# Patient Record
Sex: Female | Born: 1960 | Race: White | Hispanic: No | Marital: Married | Smoking: Current every day smoker
Health system: Southern US, Community
[De-identification: ages and names within clinical notes are randomized; demographics above are authoritative.]

## PROBLEM LIST (undated history)

## (undated) DIAGNOSIS — B370 Candidal stomatitis: Secondary | ICD-10-CM

## (undated) DIAGNOSIS — I10 Essential (primary) hypertension: Secondary | ICD-10-CM

## (undated) DIAGNOSIS — J449 Chronic obstructive pulmonary disease, unspecified: Secondary | ICD-10-CM

## (undated) DIAGNOSIS — N390 Urinary tract infection, site not specified: Secondary | ICD-10-CM

## (undated) DIAGNOSIS — K219 Gastro-esophageal reflux disease without esophagitis: Secondary | ICD-10-CM

## (undated) DIAGNOSIS — N952 Postmenopausal atrophic vaginitis: Secondary | ICD-10-CM

## (undated) DIAGNOSIS — E785 Hyperlipidemia, unspecified: Secondary | ICD-10-CM

## (undated) DIAGNOSIS — R000 Tachycardia, unspecified: Secondary | ICD-10-CM

## (undated) DIAGNOSIS — R32 Unspecified urinary incontinence: Secondary | ICD-10-CM

## (undated) DIAGNOSIS — N179 Acute kidney failure, unspecified: Secondary | ICD-10-CM

## (undated) DIAGNOSIS — N35919 Unspecified urethral stricture, male, unspecified site: Secondary | ICD-10-CM

## (undated) HISTORY — DX: Unspecified urinary incontinence: R32

## (undated) HISTORY — DX: Candidal stomatitis: B37.0

## (undated) HISTORY — DX: Chronic obstructive pulmonary disease, unspecified: J44.9

## (undated) HISTORY — DX: Postmenopausal atrophic vaginitis: N95.2

## (undated) HISTORY — DX: Gastro-esophageal reflux disease without esophagitis: K21.9

## (undated) HISTORY — PX: ABDOMINAL HYSTERECTOMY: SHX81

## (undated) HISTORY — DX: Unspecified urethral stricture, male, unspecified site: N35.919

## (undated) HISTORY — DX: Hyperlipidemia, unspecified: E78.5

## (undated) HISTORY — DX: Urinary tract infection, site not specified: N39.0

---

## 2008-08-06 ENCOUNTER — Ambulatory Visit: Payer: Self-pay | Admitting: Internal Medicine

## 2010-01-08 ENCOUNTER — Ambulatory Visit: Payer: Self-pay | Admitting: Internal Medicine

## 2010-05-11 ENCOUNTER — Ambulatory Visit: Payer: Self-pay | Admitting: Urology

## 2011-03-17 DIAGNOSIS — Z8719 Personal history of other diseases of the digestive system: Secondary | ICD-10-CM | POA: Insufficient documentation

## 2011-03-17 DIAGNOSIS — G723 Periodic paralysis: Secondary | ICD-10-CM | POA: Insufficient documentation

## 2011-03-17 DIAGNOSIS — R519 Headache, unspecified: Secondary | ICD-10-CM | POA: Insufficient documentation

## 2011-03-17 DIAGNOSIS — I10 Essential (primary) hypertension: Secondary | ICD-10-CM | POA: Insufficient documentation

## 2011-03-17 DIAGNOSIS — E7800 Pure hypercholesterolemia, unspecified: Secondary | ICD-10-CM | POA: Insufficient documentation

## 2011-03-17 DIAGNOSIS — R002 Palpitations: Secondary | ICD-10-CM | POA: Insufficient documentation

## 2011-03-17 DIAGNOSIS — IMO0001 Reserved for inherently not codable concepts without codable children: Secondary | ICD-10-CM | POA: Insufficient documentation

## 2013-01-15 ENCOUNTER — Ambulatory Visit: Payer: Self-pay | Admitting: Unknown Physician Specialty

## 2014-08-19 ENCOUNTER — Emergency Department: Payer: BLUE CROSS/BLUE SHIELD

## 2014-08-19 ENCOUNTER — Other Ambulatory Visit: Payer: Self-pay

## 2014-08-19 ENCOUNTER — Encounter: Payer: Self-pay | Admitting: General Practice

## 2014-08-19 ENCOUNTER — Emergency Department
Admission: EM | Admit: 2014-08-19 | Discharge: 2014-08-19 | Disposition: A | Discharge status: Home / Self Care | Payer: BLUE CROSS/BLUE SHIELD | Attending: Emergency Medicine | Admitting: Emergency Medicine

## 2014-08-19 DIAGNOSIS — J441 Chronic obstructive pulmonary disease with (acute) exacerbation: Secondary | ICD-10-CM | POA: Diagnosis not present

## 2014-08-19 DIAGNOSIS — Z87891 Personal history of nicotine dependence: Secondary | ICD-10-CM | POA: Insufficient documentation

## 2014-08-19 DIAGNOSIS — M7989 Other specified soft tissue disorders: Secondary | ICD-10-CM | POA: Insufficient documentation

## 2014-08-19 DIAGNOSIS — I10 Essential (primary) hypertension: Secondary | ICD-10-CM | POA: Insufficient documentation

## 2014-08-19 DIAGNOSIS — R0602 Shortness of breath: Secondary | ICD-10-CM | POA: Diagnosis present

## 2014-08-19 DIAGNOSIS — J209 Acute bronchitis, unspecified: Secondary | ICD-10-CM

## 2014-08-19 HISTORY — DX: Acute kidney failure, unspecified: N17.9

## 2014-08-19 HISTORY — DX: Tachycardia, unspecified: R00.0

## 2014-08-19 HISTORY — DX: Essential (primary) hypertension: I10

## 2014-08-19 LAB — BASIC METABOLIC PANEL
Anion gap: 12 (ref 5–15)
BUN: 8 mg/dL (ref 6–20)
CHLORIDE: 101 mmol/L (ref 101–111)
CO2: 25 mmol/L (ref 22–32)
Calcium: 8.8 mg/dL — ABNORMAL LOW (ref 8.9–10.3)
Creatinine, Ser: 0.63 mg/dL (ref 0.44–1.00)
GLUCOSE: 111 mg/dL — AB (ref 65–99)
POTASSIUM: 3.2 mmol/L — AB (ref 3.5–5.1)
SODIUM: 138 mmol/L (ref 135–145)

## 2014-08-19 LAB — CBC
HCT: 47.5 % — ABNORMAL HIGH (ref 35.0–47.0)
HEMOGLOBIN: 15.9 g/dL (ref 12.0–16.0)
MCH: 30.6 pg (ref 26.0–34.0)
MCHC: 33.4 g/dL (ref 32.0–36.0)
MCV: 91.5 fL (ref 80.0–100.0)
PLATELETS: 197 10*3/uL (ref 150–440)
RBC: 5.19 MIL/uL (ref 3.80–5.20)
RDW: 14.3 % (ref 11.5–14.5)
WBC: 10.9 10*3/uL (ref 3.6–11.0)

## 2014-08-19 MED ORDER — METHYLPREDNISOLONE SODIUM SUCC 125 MG IJ SOLR
125.0000 mg | INTRAMUSCULAR | Status: AC
  Administered 2014-08-19: 125 mg via INTRAVENOUS

## 2014-08-19 MED ORDER — ALBUTEROL SULFATE HFA 108 (90 BASE) MCG/ACT IN AERS: 2.0000 | INHALATION_SPRAY | Freq: Four times a day (QID) | RESPIRATORY_TRACT | Status: AC | PRN

## 2014-08-19 MED ORDER — ALBUTEROL SULFATE (2.5 MG/3ML) 0.083% IN NEBU
INHALATION_SOLUTION | RESPIRATORY_TRACT | Status: AC
  Administered 2014-08-19: 2.5 mg via RESPIRATORY_TRACT
  Filled 2014-08-19: qty 3

## 2014-08-19 MED ORDER — IPRATROPIUM-ALBUTEROL 0.5-2.5 (3) MG/3ML IN SOLN
3.0000 mL | Freq: Once | RESPIRATORY_TRACT | Status: AC
  Administered 2014-08-19: 3 mL via RESPIRATORY_TRACT

## 2014-08-19 MED ORDER — PREDNISONE 20 MG PO TABS: 40.0000 mg | ORAL_TABLET | Freq: Every day | ORAL | Status: DC

## 2014-08-19 MED ORDER — POTASSIUM CHLORIDE CRYS ER 20 MEQ PO TBCR
EXTENDED_RELEASE_TABLET | ORAL | Status: AC
  Administered 2014-08-19: 40 meq via ORAL
  Filled 2014-08-19: qty 2

## 2014-08-19 MED ORDER — IPRATROPIUM-ALBUTEROL 0.5-2.5 (3) MG/3ML IN SOLN
RESPIRATORY_TRACT | Status: AC
  Administered 2014-08-19: 3 mL via RESPIRATORY_TRACT
  Filled 2014-08-19: qty 9

## 2014-08-19 MED ORDER — ALBUTEROL SULFATE (2.5 MG/3ML) 0.083% IN NEBU
2.5000 mg | INHALATION_SOLUTION | RESPIRATORY_TRACT | Status: AC
Start: 2014-08-19 — End: 2014-08-19
  Administered 2014-08-19: 2.5 mg via RESPIRATORY_TRACT

## 2014-08-19 MED ORDER — METHYLPREDNISOLONE SODIUM SUCC 125 MG IJ SOLR
INTRAMUSCULAR | Status: AC
  Administered 2014-08-19: 125 mg via INTRAVENOUS
  Filled 2014-08-19: qty 2

## 2014-08-19 MED ORDER — POTASSIUM CHLORIDE CRYS ER 20 MEQ PO TBCR
40.0000 meq | EXTENDED_RELEASE_TABLET | Freq: Once | ORAL | Status: AC
  Administered 2014-08-19: 40 meq via ORAL

## 2014-08-19 NOTE — ED Notes (Signed)
Pt taken to Xray.

## 2014-08-19 NOTE — ED Notes (Signed)
Pt arrives with complaints of difficulty breathing and increasing SOB, pt states she was seen at urgent care of Friday and diagnosed with bronchitis and given abx and steroids, pt states increasing cough and SOB ever since, pt wheezing, pt alert and oriented

## 2014-08-19 NOTE — Discharge Instructions (Signed)
Acute Bronchitis  Please follow-up with Dr. Hall Busing within the next 1-2 days. Return to the ER right away if you begin feeling short of breath similar to how he did when you came today. In addition, return to the ER right away if you feel that you are worsening, having trouble breathing again or are short of breath, develop any chest pain, or any other new concerns arise.  Bronchitis is inflammation of the airways that extend from the windpipe into the lungs (bronchi). The inflammation often causes mucus to develop. This leads to a cough, which is the most common symptom of bronchitis.  In acute bronchitis, the condition usually develops suddenly and goes away over time, usually in a couple weeks. Smoking, allergies, and asthma can make bronchitis worse. Repeated episodes of bronchitis may cause further lung problems.  CAUSES Acute bronchitis is most often caused by the same virus that causes a cold. The virus can spread from person to person (contagious) through coughing, sneezing, and touching contaminated objects. SIGNS AND SYMPTOMS   Cough.   Fever.   Coughing up mucus.   Body aches.   Chest congestion.   Chills.   Shortness of breath.   Sore throat.  DIAGNOSIS  Acute bronchitis is usually diagnosed through a physical exam. Your health care provider will also ask you questions about your medical history. Tests, such as chest X-rays, are sometimes done to rule out other conditions.  TREATMENT  Acute bronchitis usually goes away in a couple weeks. Oftentimes, no medical treatment is necessary. Medicines are sometimes given for relief of fever or cough. Antibiotic medicines are usually not needed but may be prescribed in certain situations. In some cases, an inhaler may be recommended to help reduce shortness of breath and control the cough. A cool mist vaporizer may also be used to help thin bronchial secretions and make it easier to clear the chest.  HOME CARE INSTRUCTIONS  Get  plenty of rest.   Drink enough fluids to keep your urine clear or pale yellow (unless you have a medical condition that requires fluid restriction). Increasing fluids may help thin your respiratory secretions (sputum) and reduce chest congestion, and it will prevent dehydration.   Take medicines only as directed by your health care provider.  If you were prescribed an antibiotic medicine, finish it all even if you start to feel better.  Avoid smoking and secondhand smoke. Exposure to cigarette smoke or irritating chemicals will make bronchitis worse. If you are a smoker, consider using nicotine gum or skin patches to help control withdrawal symptoms. Quitting smoking will help your lungs heal faster.   Reduce the chances of another bout of acute bronchitis by washing your hands frequently, avoiding people with cold symptoms, and trying not to touch your hands to your mouth, nose, or eyes.   Keep all follow-up visits as directed by your health care provider.  SEEK MEDICAL CARE IF: Your symptoms do not improve after 1 week of treatment.  SEEK IMMEDIATE MEDICAL CARE IF:  You develop an increased fever or chills.   You have chest pain.   You have severe shortness of breath.  You have bloody sputum.   You develop dehydration.  You faint or repeatedly feel like you are going to pass out.  You develop repeated vomiting.  You develop a severe headache. MAKE SURE YOU:   Understand these instructions.  Will watch your condition.  Will get help right away if you are not doing well or get worse.  Document Released: 04/08/2004 Document Revised: 07/16/2013 Document Reviewed: 08/22/2012 [facility] Patient Information 2015 [place], [place]. This information is not intended to replace advice given to you by your health care provider. Make sure you discuss any questions you have with your health care provider.

## 2014-08-19 NOTE — ED Provider Notes (Signed)
[facility] Emergency Department Provider Note  ____________________________________________  Time seen: Approximately 11:12 AM  I have reviewed the triage vital signs and the nursing notes.   HISTORY  Chief Complaint Cough; Shortness of Breath; and Wheezing    HPI Erika Fischer is a 54 y.o. female with a history of previous asthma, who is recently diagnosed with bronchitis and treated with Levaquin for the last 2 days along with cough medicine. She saw her primary care doctor 2 days ago as she's been having a ongoing cough for about 5-6 days with occasional runny nose, she states the cough just won't get better, and she is occasionally wheezing for the last 3-4 days.  Cough is occasionally productive of slight green sputum. She was having fevers, but these have improved today. Overall, she feels that she is "just not getting better" and is "tired of coughing". She denies being in any pain. She denies any chest tightness. Occasionally she'll get a slight ache while she coughs.  She is not any prednisone or albuterol at present.  No recent surgeries. She is a long-time smoker, but recently quit. Leg swelling. No chest pain. No vomiting, no abdominal pain.  Past Medical History  Diagnosis Date  . Hypertension   . Tachycardia     There are no active problems to display for this patient.   History reviewed. No pertinent past surgical history.  No current outpatient prescriptions on file.  Allergies Review of patient's allergies indicates no known allergies.  No family history on file.  Social History History  Substance Use Topics  . Smoking status: Current Every Day Smoker -- 0.50 packs/day    Types: Cigarettes  . Smokeless tobacco: Never Used  . Alcohol Use: No    Review of Systems Constitutional: Having occasional low-grade fevers up until yesterday. Eyes: No visual changes. ENT: No sore throat. Cardiovascular: Denies chest  pain. Respiratory: See history of present illness. She states she feels mild shortness of breath was worse with walking. Gastrointestinal: No abdominal pain.  No nausea, no vomiting.  No diarrhea.  No constipation. Genitourinary: Negative for dysuria. Musculoskeletal: Negative for back pain. Skin: Negative for rash. Neurological: Negative for headaches, focal weakness or numbness.  Leg swelling. Short of breath does not change with lying down. Shortness of breath is primarily brought on by episodes of coughing.  10-point ROS otherwise negative.  ____________________________________________   PHYSICAL EXAM:  VITAL SIGNS: ED Triage Vitals  Enc Vitals Group     BP 08/19/14 1025 148/97 mmHg     Pulse Rate 08/19/14 1025 90     Resp 08/19/14 1025 25     Temp 08/19/14 1025 97.9 F (36.6 C)     Temp Source 08/19/14 1025 Oral     SpO2 08/19/14 1025 95 %     Weight 08/19/14 1025 180 lb (81.647 kg)     Height 08/19/14 1025 5\' 4"  (1.626 m)     Head Cir --      Peak Flow --      Pain Score 08/19/14 1026 8     Pain Loc --      Pain Edu? --      Excl. in [place]? --     Constitutional: Alert and oriented. Well appearing and in no acute distress. Eyes: Conjunctivae are normal. PERRL. EOMI. Head: Atraumatic. Nose: No congestion/rhinnorhea. Mouth/Throat: Mucous membranes are moist.  Oropharynx non-erythematous. Neck: No stridor.   Cardiovascular: Normal rate, regular rhythm. Grossly normal heart sounds.  Good  peripheral circulation. Respiratory: Normal respiratory effort.  No retractions. Bilateral equal and expiratory wheezes. Patient's work of breathing is normal, however she does have obvious wheezing throughout the lungs on expiration. There are no rales or crackles. Gastrointestinal: Soft and nontender. No distention. No abdominal bruits. No CVA tenderness. Musculoskeletal: No lower extremity tenderness nor edema.  No joint effusions. Neurologic:  Normal speech and language. No gross  focal neurologic deficits are appreciated. Speech is normal.  Skin:  Skin is warm, dry and intact. No rash noted. Psychiatric: Mood and affect are normal. Speech and behavior are normal.  ____________________________________________   LABS (all labs ordered are listed, but only abnormal results are displayed)  Labs Reviewed - No data to display ____________________________________________  EKG   Date: 08/19/2014  Rate: 75  Rhythm: normal sinus rhythm  QRS Axis: normal  Intervals: normal  ST/T Wave abnormalities: normal except minimal TWI in V3 only  Conduction Disutrbances: normal  Narrative Interpretation: No acute ischemic change.   ____________________________________________  RADIOLOGY  CLINICAL DATA: Cough, shortness of breath and wheezing.  EXAM: CHEST - 2 VIEW  COMPARISON: None  FINDINGS: The heart size and mediastinal contours are within normal limits. Mild bibasilar atelectasis present. There is no evidence of pulmonary edema, consolidation, pneumothorax, nodule or pleural fluid. The visualized skeletal structures are unremarkable.  IMPRESSION: No active disease. ____________________________________________   PROCEDURES  Procedure(s) performed: None  Critical Care performed: No  ____________________________________________   INITIAL IMPRESSION / ASSESSMENT AND PLAN / ED COURSE  Pertinent labs & imaging results that were available during my care of the patient were reviewed by me and considered in my medical decision making (see chart for details).  Patient resents with cough, fever, recently being treated on day 3 now of Levaquin. It appears primarily that the patient is suffering from what is likely an upper respiratory illness, which is exacerbated bronchospasm and acute bronchitis. I suspect she may have an underlying history of COPD which has not been diagnosed, but at the present time bronchospasm appears to be the primary driver of her  dyspnea. She does not exhibit shortness of breath by exam and she is breathing quite comfortably, but she does have obvious end expiratory wheezes. Her fevers have improved and clinically I do not suspect she has pneumonia, but we will obtain a chest x-ray.  I will place her on prednisone and provide her with an MDI as she does not have one. We will give her DuoNeb abs and Solu-Medrol in the ER to initiate treatment. I'll obtain a chest x-ray to evaluate for any evidence of pneumonia. She has no symptoms of acute coronary syndrome, we will obtain an EKG.  I anticipate the patient will likely be able to be discharged, she is not hypoxic, she does not demonstrate evidence of difficulty breathing. We will treat her bronchospasm. She has a PCP who she can follow up closely with.  ----------------------------------------- 2:10 PM on 08/19/2014 -----------------------------------------   Patient appears much improved. She states she feels much better now. Her lungs are much improved. She is currently saturating 97% with a respiratory rate of about 14-16. She has no evidence of distress and is speaking full sentences.  I discussed carefully with the patient her treatment recommendations including adding prednisone and albuterol inhaler. She'll follow-up with her physician. Should she develop shortness of breath again similar to how she experienced today, or she begins have chest pain, vomiting, high fevers, or feels that she is not improving she will return to the  ER right away. She is very stable at this time. Because it will be a few hours before she can get her albuterol prescription filled, we will give her 1 additional nebulizer here prior to departure. ____________________________________________   FINAL CLINICAL IMPRESSION(S) / ED DIAGNOSES  COPD exacerbation, initial, acute Bronchitis initial, acute with bronchospasm   Delman Kitten, MD 08/19/14 [phone]

## 2014-08-19 NOTE — ED Notes (Signed)
Pt. Arrived to ed from home with reports of experiencing cough, congestion and SOB. Seen by PCP on Friday and reported that she had Bronchitis/Upper Resp. Infection.  Pt placed on oral antibotics and inhaler. Pt reports no improvement. Actively wheezing. Labored respirtations noted.

## 2014-09-09 ENCOUNTER — Other Ambulatory Visit: Payer: Self-pay

## 2014-09-09 ENCOUNTER — Encounter: Payer: Self-pay | Admitting: *Deleted

## 2014-09-09 ENCOUNTER — Emergency Department: Payer: BLUE CROSS/BLUE SHIELD

## 2014-09-09 ENCOUNTER — Emergency Department
Admission: EM | Admit: 2014-09-09 | Discharge: 2014-09-09 | Disposition: A | Discharge status: Home / Self Care | Payer: BLUE CROSS/BLUE SHIELD | Attending: Emergency Medicine | Admitting: Emergency Medicine

## 2014-09-09 DIAGNOSIS — R509 Fever, unspecified: Secondary | ICD-10-CM | POA: Diagnosis not present

## 2014-09-09 DIAGNOSIS — M545 Low back pain: Secondary | ICD-10-CM | POA: Insufficient documentation

## 2014-09-09 DIAGNOSIS — R300 Dysuria: Secondary | ICD-10-CM | POA: Insufficient documentation

## 2014-09-09 DIAGNOSIS — Z720 Tobacco use: Secondary | ICD-10-CM | POA: Diagnosis not present

## 2014-09-09 DIAGNOSIS — K5900 Constipation, unspecified: Secondary | ICD-10-CM | POA: Diagnosis not present

## 2014-09-09 DIAGNOSIS — R109 Unspecified abdominal pain: Secondary | ICD-10-CM

## 2014-09-09 DIAGNOSIS — I10 Essential (primary) hypertension: Secondary | ICD-10-CM | POA: Insufficient documentation

## 2014-09-09 LAB — CBC WITH DIFFERENTIAL/PLATELET
BASOS PCT: 1 %
Basophils Absolute: 0.1 10*3/uL (ref 0–0.1)
Eosinophils Absolute: 0.1 10*3/uL (ref 0–0.7)
Eosinophils Relative: 1 %
HCT: 43.2 % (ref 35.0–47.0)
HEMOGLOBIN: 14.3 g/dL (ref 12.0–16.0)
LYMPHS ABS: 3 10*3/uL (ref 1.0–3.6)
Lymphocytes Relative: 23 %
MCH: 30.1 pg (ref 26.0–34.0)
MCHC: 33.1 g/dL (ref 32.0–36.0)
MCV: 90.9 fL (ref 80.0–100.0)
MONO ABS: 1 10*3/uL — AB (ref 0.2–0.9)
MONOS PCT: 8 %
NEUTROS ABS: 8.5 10*3/uL — AB (ref 1.4–6.5)
NEUTROS PCT: 67 %
PLATELETS: 332 10*3/uL (ref 150–440)
RBC: 4.75 MIL/uL (ref 3.80–5.20)
RDW: 13.9 % (ref 11.5–14.5)
WBC: 12.7 10*3/uL — AB (ref 3.6–11.0)

## 2014-09-09 LAB — URINALYSIS COMPLETE WITH MICROSCOPIC (ARMC ONLY)
Bacteria, UA: NONE SEEN
Bilirubin Urine: NEGATIVE
Glucose, UA: NEGATIVE mg/dL
HGB URINE DIPSTICK: NEGATIVE
LEUKOCYTES UA: NEGATIVE
NITRITE: NEGATIVE
PH: 5 (ref 5.0–8.0)
PROTEIN: NEGATIVE mg/dL
Specific Gravity, Urine: 1.02 (ref 1.005–1.030)

## 2014-09-09 LAB — BASIC METABOLIC PANEL
Anion gap: 11 (ref 5–15)
BUN: 10 mg/dL (ref 6–20)
CHLORIDE: 100 mmol/L — AB (ref 101–111)
CO2: 24 mmol/L (ref 22–32)
CREATININE: 0.84 mg/dL (ref 0.44–1.00)
Calcium: 9.4 mg/dL (ref 8.9–10.3)
Glucose, Bld: 131 mg/dL — ABNORMAL HIGH (ref 65–99)
Potassium: 4 mmol/L (ref 3.5–5.1)
Sodium: 135 mmol/L (ref 135–145)

## 2014-09-09 LAB — TROPONIN I: Troponin I: <0.03 ng/mL (ref <0.031)

## 2014-09-09 MED ORDER — ONDANSETRON HCL 4 MG/2ML IJ SOLN
4.0000 mg | Freq: Once | INTRAMUSCULAR | Status: AC
  Administered 2014-09-09: 4 mg via INTRAVENOUS

## 2014-09-09 MED ORDER — CEFTRIAXONE SODIUM IN DEXTROSE 20 MG/ML IV SOLN
1.0000 g | Freq: Once | INTRAVENOUS | Status: DC
  Filled 2014-09-09: qty 50

## 2014-09-09 MED ORDER — DIAZEPAM 5 MG PO TABS: 5.0000 mg | ORAL_TABLET | Freq: Three times a day (TID) | ORAL | Status: DC | PRN

## 2014-09-09 MED ORDER — IOHEXOL 300 MG/ML  SOLN
100.0000 mL | Freq: Once | INTRAMUSCULAR | Status: AC | PRN
  Administered 2014-09-09: 100 mL via INTRAVENOUS
  Filled 2014-09-09: qty 100

## 2014-09-09 MED ORDER — SODIUM CHLORIDE 0.9 % IV SOLN
Freq: Once | INTRAVENOUS | Status: AC
  Administered 2014-09-09: 1000 mL via INTRAVENOUS

## 2014-09-09 MED ORDER — KETOROLAC TROMETHAMINE 30 MG/ML IJ SOLN
INTRAMUSCULAR | Status: AC
  Filled 2014-09-09: qty 1

## 2014-09-09 MED ORDER — POLYETHYLENE GLYCOL 3350 17 G PO PACK: 17.0000 g | PACK | Freq: Every day | ORAL | Status: DC

## 2014-09-09 MED ORDER — ONDANSETRON HCL 4 MG/2ML IJ SOLN
INTRAMUSCULAR | Status: AC
  Filled 2014-09-09: qty 2

## 2014-09-09 MED ORDER — KETOROLAC TROMETHAMINE 30 MG/ML IJ SOLN
30.0000 mg | Freq: Once | INTRAMUSCULAR | Status: AC
  Administered 2014-09-09: 30 mg via INTRAVENOUS

## 2014-09-09 MED ORDER — CEFTRIAXONE SODIUM IN DEXTROSE 20 MG/ML IV SOLN
INTRAVENOUS | Status: AC
  Filled 2014-09-09: qty 50

## 2014-09-09 MED ORDER — IBUPROFEN 800 MG PO TABS: 800.0000 mg | ORAL_TABLET | Freq: Three times a day (TID) | ORAL | Status: DC | PRN

## 2014-09-09 MED ORDER — IOHEXOL 240 MG/ML SOLN
25.0000 mL | Freq: Once | INTRAMUSCULAR | Status: AC | PRN
  Administered 2014-09-09: 25 mL via ORAL
  Filled 2014-09-09: qty 25

## 2014-09-09 NOTE — ED Notes (Signed)
Pt reports that she has been treated for a UTI, she is now having lower back pain and feeling weaker.

## 2014-09-09 NOTE — ED Provider Notes (Signed)
[facility] Emergency Department Provider Note     Time seen: ----------------------------------------- 2:14 PM on 09/09/2014 -----------------------------------------    I have reviewed the triage vital signs and the nursing notes.   HISTORY  Chief Complaint Back Pain    HPI Erika Fischer is a 54 y.o. female who presents ER for persistent left-sided flank pain and low back pain. Patient states she's been on antibiotics for most of this month for consultation bronchitis and bladder infections. Patient states she just finished a course of Cipro and is not feeling any better, still has dysuria.Pain left flank is sharp, nothing makes it better or worse.   Past Medical History  Diagnosis Date  . Hypertension   . Tachycardia   . Kidney failure, acute     There are no active problems to display for this patient.   History reviewed. No pertinent past surgical history.  Allergies Review of patient's allergies indicates no known allergies.  Social History History  Substance Use Topics  . Smoking status: Current Every Day Smoker -- 0.50 packs/day    Types: Cigarettes  . Smokeless tobacco: Never Used  . Alcohol Use: No    Review of Systems Constitutional: Positive for fever Eyes: Negative for visual changes. ENT: Negative for sore throat. Cardiovascular: Negative for chest pain. Respiratory: Negative for shortness of breath. Gastrointestinal: Negative for abdominal pain, positive for nausea Genitourinary: Positive for dysuria Musculoskeletal: Positive for left-sided back and flank pain. Skin: Negative for rash. Neurological: Negative for headaches, focal weakness or numbness.  10-point ROS otherwise negative.  ____________________________________________   PHYSICAL EXAM:  VITAL SIGNS: ED Triage Vitals  Enc Vitals Group     BP 09/09/14 1026 128/107 mmHg     Pulse Rate 09/09/14 1026 103     Resp 09/09/14 1026 20     Temp 09/09/14  1026 99.3 F (37.4 C)     Temp Source 09/09/14 1026 Oral     SpO2 09/09/14 1026 96 %     Weight 09/09/14 1026 175 lb (79.379 kg)     Height 09/09/14 1026 5\' 3"  (1.6 m)     Head Cir --      Peak Flow --      Pain Score 09/09/14 1002 8     Pain Loc --      Pain Edu? --      Excl. in [place]? --     Constitutional: Alert and oriented. Well appearing and in no distress. Eyes: Conjunctivae are normal. PERRL. Normal extraocular movements. ENT   Head: Normocephalic and atraumatic.   Nose: No congestion/rhinnorhea.   Mouth/Throat: Mucous membranes are moist.   Neck: No stridor. Hematological/Lymphatic/Immunilogical: No cervical lymphadenopathy. Cardiovascular: Normal rate, regular rhythm. Normal and symmetric distal pulses are present in all extremities. No murmurs, rubs, or gallops. Respiratory: Normal respiratory effort without tachypnea nor retractions. Breath sounds are clear and equal bilaterally. No wheezes/rales/rhonchi. Gastrointestinal: Left flank tenderness, no rebound or guarding. Positive bowel sounds. Positive left CVA tenderness. Musculoskeletal: Nontender with normal range of motion in all extremities. No joint effusions.  No lower extremity tenderness nor edema. Neurologic:  Normal speech and language. No gross focal neurologic deficits are appreciated. Speech is normal. No gait instability. Skin:  Skin is warm, dry and intact. No rash noted. Psychiatric: Mood and affect are normal. Speech and behavior are normal. Patient exhibits appropriate insight and judgment. ____________________________________________  EKG: Interpreted by me. Normal sinus rhythm, with normal axis normal intervals no evidence of hypertrophy or acute  infarction. Rate is 88 beats per minute  ____________________________________________  ED COURSE:  Pertinent labs & imaging results that were available during my care of the patient were reviewed by me and considered in my medical decision making  (see chart for details). Patient with persistent left back and abdominal pain, dysuria. Patient's symptoms consistent with pyelonephritis. We'll evaluate with CT scan and IV medications. ____________________________________________    LABS (pertinent positives/negatives)  Labs Reviewed  CBC WITH DIFFERENTIAL/PLATELET - Abnormal; Notable for the following:    WBC 12.7 (*)    Neutro Abs 8.5 (*)    Monocytes Absolute 1.0 (*)    All other components within normal limits  BASIC METABOLIC PANEL - Abnormal; Notable for the following:    Chloride 100 (*)    Glucose, Bld 131 (*)    All other components within normal limits  URINALYSIS COMPLETEWITH MICROSCOPIC ([facility] ONLY) - Abnormal; Notable for the following:    Color, Urine YELLOW (*)    APPearance CLEAR (*)    Ketones, ur TRACE (*)    Squamous Epithelial / LPF 0-5 (*)    All other components within normal limits  TROPONIN I    RADIOLOGY Images were viewed by me  CT abdomen and pelvis with contrast IMPRESSION: 1. No acute inflammatory process within abdomen or pelvis. 2. No hydronephrosis or hydroureter. 3. No small bowel obstruction. 4. Moderate stool is noted in right colon and proximal transverse colon. There is no evidence of small bowel obstruction. No colitis or diverticulitis. No pericecal inflammation. 5. Status post hysterectomy. ____________________________________________  FINAL ASSESSMENT AND PLAN  Flank pain, constipation  Plan: Patient with lab and x-ray findings as dictated above. Patient does have some evidence of constipation. Back pain is likely not from her urine. I'll resend a urine culture, advise stopping all antibiotics until we had a positive urine culture. She's been on antibiotics for most of this month. Advise Motrin and Valium for her pain, MiraLAX for constipation. She is advised to follow-up with her doctor in 2-3 days for reevaluation.   Earleen Newport, MD   Earleen Newport,  MD 09/09/14 [phone]

## 2014-09-09 NOTE — ED Notes (Signed)
Also has had episodes of sharp left chest pain

## 2014-09-09 NOTE — Discharge Instructions (Signed)
Abdominal Pain, Women °Abdominal (stomach, pelvic, or belly) pain can be caused by many things. It is important to tell your doctor: °· The location of the pain. °· Does it come and go or is it present all the time? °· Are there things that start the pain (eating certain foods, exercise)? °· Are there other symptoms associated with the pain (fever, nausea, vomiting, diarrhea)? °All of this is helpful to know when trying to find the cause of the pain. °CAUSES  °· Stomach: virus or bacteria infection, or ulcer. °· Intestine: appendicitis (inflamed appendix), regional ileitis (Crohn's disease), ulcerative colitis (inflamed colon), irritable bowel syndrome, diverticulitis (inflamed diverticulum of the colon), or cancer of the stomach or intestine. °· Gallbladder disease or stones in the gallbladder. °· Kidney disease, kidney stones, or infection. °· Pancreas infection or cancer. °· Fibromyalgia (pain disorder). °· Diseases of the female organs: °¨ Uterus: fibroid (non-cancerous) tumors or infection. °¨ Fallopian tubes: infection or tubal pregnancy. °¨ Ovary: cysts or tumors. °¨ Pelvic adhesions (scar tissue). °¨ Endometriosis (uterus lining tissue growing in the pelvis and on the pelvic organs). °¨ Pelvic congestion syndrome (female organs filling up with blood just before the menstrual period). °¨ Pain with the menstrual period. °¨ Pain with ovulation (producing an egg). °¨ Pain with an IUD (intrauterine device, birth control) in the uterus. °¨ Cancer of the female organs. °· Functional pain (pain not caused by a disease, may improve without treatment). °· Psychological pain. °· Depression. °DIAGNOSIS  °Your doctor will decide the seriousness of your pain by doing an examination. °· Blood tests. °· X-rays. °· Ultrasound. °· CT scan (computed tomography, special type of X-ray). °· MRI (magnetic resonance imaging). °· Cultures, for infection. °· Barium enema (dye inserted in the large intestine, to better view it with  X-rays). °· Colonoscopy (looking in intestine with a lighted tube). °· Laparoscopy (minor surgery, looking in abdomen with a lighted tube). °· Major abdominal exploratory surgery (looking in abdomen with a large incision). °TREATMENT  °The treatment will depend on the cause of the pain.  °· Many cases can be observed and treated at home. °· Over-the-counter medicines recommended by your caregiver. °· Prescription medicine. °· Antibiotics, for infection. °· Birth control pills, for painful periods or for ovulation pain. °· Hormone treatment, for endometriosis. °· Nerve blocking injections. °· Physical therapy. °· Antidepressants. °· Counseling with a psychologist or psychiatrist. °· Minor or major surgery. °HOME CARE INSTRUCTIONS  °· Do not take laxatives, unless directed by your caregiver. °· Take over-the-counter pain medicine only if ordered by your caregiver. Do not take aspirin because it can cause an upset stomach or bleeding. °· Try a clear liquid diet (broth or water) as ordered by your caregiver. Slowly move to a bland diet, as tolerated, if the pain is related to the stomach or intestine. °· Have a thermometer and take your temperature several times a day, and record it. °· Bed rest and sleep, if it helps the pain. °· Avoid sexual intercourse, if it causes pain. °· Avoid stressful situations. °· Keep your follow-up appointments and tests, as your caregiver orders. °· If the pain does not go away with medicine or surgery, you may try: °¨ Acupuncture. °¨ Relaxation exercises (yoga, meditation). °¨ Group therapy. °¨ Counseling. °SEEK MEDICAL CARE IF:  °· You notice certain foods cause stomach pain. °· Your home care treatment is not helping your pain. °· You need stronger pain medicine. °· You want your IUD removed. °· You feel faint or   lightheaded. °· You develop nausea and vomiting. °· You develop a rash. °· You are having side effects or an allergy to your medicine. °SEEK IMMEDIATE MEDICAL CARE IF:  °· Your  pain does not go away or gets worse. °· You have a fever. °· Your pain is felt only in portions of the abdomen. The right side could possibly be appendicitis. The left lower portion of the abdomen could be colitis or diverticulitis. °· You are passing blood in your stools (bright red or black tarry stools, with or without vomiting). °· You have blood in your urine. °· You develop chills, with or without a fever. °· You pass out. °MAKE SURE YOU:  °· Understand these instructions. °· Will watch your condition. °· Will get help right away if you are not doing well or get worse. °Document Released: 12/27/2006 Document Revised: 07/16/2013 Document Reviewed: 01/16/2009 °ExitCare® Patient Information ©2015 [employer]. This information is not intended to replace advice given to you by your health care provider. Make sure you discuss any questions you have with your health care provider. ° °Constipation °Constipation is when a person has fewer than three bowel movements a week, has difficulty having a bowel movement, or has stools that are dry, hard, or larger than normal. As people grow older, constipation is more common. If you try to fix constipation with medicines that make you have a bowel movement (laxatives), the problem may get worse. Long-term laxative use may cause the muscles of the colon to become weak. A low-fiber diet, not taking in enough fluids, and taking certain medicines may make constipation worse.  °CAUSES  °· Certain medicines, such as antidepressants, pain medicine, iron supplements, antacids, and water pills.   °· Certain diseases, such as diabetes, irritable bowel syndrome (IBS), thyroid disease, or depression.   °· Not drinking enough water.   °· Not eating enough fiber-rich foods.   °· Stress or travel.   °· Lack of physical activity or exercise.   °· Ignoring the urge to have a bowel movement.   °· Using laxatives too much.   °SIGNS AND SYMPTOMS  °· Having fewer than three bowel movements a  week.   °· Straining to have a bowel movement.   °· Having stools that are hard, dry, or larger than normal.   °· Feeling full or bloated.   °· Pain in the lower abdomen.   °· Not feeling relief after having a bowel movement.   °DIAGNOSIS  °Your health care provider will take a medical history and perform a physical exam. Further testing may be done for severe constipation. Some tests may include: °· A barium enema X-ray to examine your rectum, colon, and, sometimes, your small intestine.   °· A sigmoidoscopy to examine your lower colon.   °· A colonoscopy to examine your entire colon. °TREATMENT  °Treatment will depend on the severity of your constipation and what is causing it. Some dietary treatments include drinking more fluids and eating more fiber-rich foods. Lifestyle treatments may include regular exercise. If these diet and lifestyle recommendations do not help, your health care provider may recommend taking over-the-counter laxative medicines to help you have bowel movements. Prescription medicines may be prescribed if over-the-counter medicines do not work.  °HOME CARE INSTRUCTIONS  °· Eat foods that have a lot of fiber, such as fruits, vegetables, whole grains, and beans. °· Limit foods high in fat and processed sugars, such as french fries, hamburgers, cookies, candies, and soda.   °· A fiber supplement may be added to your diet if you cannot get enough fiber from foods.   °·   Drink enough fluids to keep your urine clear or pale yellow.   °· Exercise regularly or as directed by your health care provider.   °· Go to the restroom when you have the urge to go. Do not hold it.   °· Only take over-the-counter or prescription medicines as directed by your health care provider. Do not take other medicines for constipation without talking to your health care provider first.   °SEEK IMMEDIATE MEDICAL CARE IF:  °· You have bright red blood in your stool.   °· Your constipation lasts for more than 4 days or gets  worse.   °· You have abdominal or rectal pain.   °· You have thin, pencil-like stools.   °· You have unexplained weight loss. °MAKE SURE YOU:  °· Understand these instructions. °· Will watch your condition. °· Will get help right away if you are not doing well or get worse. °Document Released: 11/28/2003 Document Revised: 03/06/2013 Document Reviewed: 12/11/2012 °ExitCare® Patient Information ©2015 [employer]. This information is not intended to replace advice given to you by your health care provider. Make sure you discuss any questions you have with your health care provider. ° °

## 2014-09-11 LAB — URINE CULTURE: Special Requests: NORMAL

## 2015-04-23 ENCOUNTER — Encounter: Payer: Self-pay | Admitting: *Deleted

## 2015-04-25 ENCOUNTER — Ambulatory Visit (INDEPENDENT_AMBULATORY_CARE_PROVIDER_SITE_OTHER): Payer: BLUE CROSS/BLUE SHIELD | Admitting: Urology

## 2015-04-25 ENCOUNTER — Encounter: Payer: Self-pay | Admitting: Urology

## 2015-04-25 VITALS — BP 123/74 | HR 106 | Ht 63.0 in | Wt 178.0 lb

## 2015-04-25 DIAGNOSIS — R350 Frequency of micturition: Secondary | ICD-10-CM | POA: Diagnosis not present

## 2015-04-25 DIAGNOSIS — N393 Stress incontinence (female) (male): Secondary | ICD-10-CM | POA: Diagnosis not present

## 2015-04-25 DIAGNOSIS — N3941 Urge incontinence: Secondary | ICD-10-CM

## 2015-04-25 LAB — URINALYSIS, COMPLETE
Bilirubin, UA: NEGATIVE
Glucose, UA: NEGATIVE
Ketones, UA: NEGATIVE
Leukocytes, UA: NEGATIVE
Nitrite, UA: NEGATIVE
PROTEIN UA: NEGATIVE
RBC, UA: NEGATIVE
SPEC GRAV UA: 1.015 (ref 1.005–1.030)
UUROB: 1 mg/dL (ref 0.2–1.0)
pH, UA: 8.5 — ABNORMAL HIGH (ref 5.0–7.5)

## 2015-04-25 LAB — MICROSCOPIC EXAMINATION
BACTERIA UA: NONE SEEN
Renal Epithel, UA: NONE SEEN /hpf
WBC, UA: NONE SEEN /hpf (ref 0–?)

## 2015-04-25 LAB — BLADDER SCAN AMB NON-IMAGING: Scan Result: 105

## 2015-04-25 NOTE — Progress Notes (Signed)
04/25/2015 11:53 AM   Erika Fischer June 05, 1960 [id number]  Referring provider: Albina Billet, MD [address]   [place], [place] [address]  Chief Complaint  Patient presents with  . Urinary Frequency    uti?    HPI: Patient is a 55 year old Caucasian female who presents today requesting urethral dilation.  Patient has undergone urethral dilation on an intermittent basis over the last several years. It is been approximately 2 years since we have seen her in our office.  Patient states she is experiencing frequency of urination, urgency, nocturia and leakage of urine.  She has relief of these symptoms for about 1 month after she undergoes urethral dilation.  She had been on vaginal estrogen cream in the past, but since she has not been seen in 2 years she is no longer using the cream.  She states she mostly loses urine when she rises to a standing position, laughs, sneezes or strains.  She does report some episodes of urge incontinence, such as losing urine when she hears running water came get to the bathroom on time. It does not happen as frequently.  She denies any gross hematuria, dysuria or suprapubic pain. Her UA today is unremarkable. Her PVR is 105 mL.   PMH: Past Medical History  Diagnosis Date  . Hypertension   . Tachycardia   . Kidney failure, acute ([place])   . HLD (hyperlipidemia)   . Acid reflux   . Urethral stricture   . Urinary incontinence   . Vaginal atrophy   . COPD (chronic obstructive pulmonary disease) ([place])   . Thrush     Surgical History: Past Surgical History  Procedure Laterality Date  . Abdominal hysterectomy      Home Medications:    Medication List       This list is accurate as of: 04/25/15 11:53 AM.  Always use your most recent med list.               albuterol 108 (90 Base) MCG/ACT inhaler  Commonly known as:  PROVENTIL HFA;VENTOLIN HFA  Inhale 2 puffs into the lungs every 6 (six) hours as needed for wheezing or shortness of  breath.     chlorpheniramine-HYDROcodone 10-8 MG/5ML Suer  Commonly known as:  TUSSIONEX  Take 5 mLs by mouth every 12 (twelve) hours as needed for cough. Reported on 04/25/2015     citalopram 40 MG tablet  Commonly known as:  CELEXA  Take 40 mg by mouth daily.     diazepam 5 MG tablet  Commonly known as:  VALIUM  Take 1 tablet (5 mg total) by mouth every 8 (eight) hours as needed for muscle spasms.     DULERA 100-5 MCG/ACT Aero  Generic drug:  mometasone-formoterol     esomeprazole 40 MG capsule  Commonly known as:  NEXIUM  Take 40 mg by mouth daily. Reported on 04/25/2015     fluconazole 150 MG tablet  Commonly known as:  DIFLUCAN  Take 150 mg by mouth daily. Reported on 04/25/2015     guaiFENesin 600 MG 12 hr tablet  Commonly known as:  MUCINEX  Take 600 mg by mouth 2 (two) times daily as needed for cough or to loosen phlegm. Reported on 04/25/2015     hydrochlorothiazide 12.5 MG capsule  Commonly known as:  MICROZIDE  Take 12.5 mg by mouth daily.     ibuprofen 200 MG tablet  Commonly known as:  ADVIL,MOTRIN  Take 400 mg by  mouth every 4 (four) hours as needed for moderate pain.     ibuprofen 800 MG tablet  Commonly known as:  ADVIL,MOTRIN  Take 1 tablet (800 mg total) by mouth every 8 (eight) hours as needed.     levofloxacin 500 MG tablet  Commonly known as:  LEVAQUIN  Take 500 mg by mouth daily. Reported on 04/25/2015     metoprolol succinate 100 MG 24 hr tablet  Commonly known as:  TOPROL-XL  Take 100 mg by mouth 2 (two) times daily. Take with or immediately following a meal.     nystatin 100000 UNIT/ML suspension  Commonly known as:  MYCOSTATIN  Reported on 04/25/2015     omeprazole 40 MG capsule  Commonly known as:  PRILOSEC  Take 40 mg by mouth daily.     polyethylene glycol packet  Commonly known as:  MIRALAX / GLYCOLAX  Take 17 g by mouth daily.     predniSONE 20 MG tablet  Commonly known as:  DELTASONE  Take 2 tablets (40 mg total) by mouth daily.      rosuvastatin 20 MG tablet  Commonly known as:  CRESTOR  Take 20 mg by mouth daily.     telmisartan 80 MG tablet  Commonly known as:  MICARDIS  Take 80 mg by mouth daily.     tiotropium 18 MCG inhalation capsule  Commonly known as:  [place] into inhaler and inhale.        Allergies:  Allergies  Allergen Reactions  . Ace Inhibitors Other (See Comments)  . Lisinopril Other (See Comments)    Other Reaction: OTHER REACTION  . Sulfa Antibiotics     Family History: Family History  Problem Relation Age of Onset  . Kidney disease Neg Hx   . Bladder Cancer Neg Hx     Social History:  reports that she has been smoking Cigarettes.  She has been smoking about 0.50 packs per day. She has never used smokeless tobacco. She reports that she does not drink alcohol or use illicit drugs.  ROS: UROLOGY Frequent Urination?: Yes Hard to postpone urination?: Yes Burning/pain with urination?: No Get up at night to urinate?: Yes Leakage of urine?: Yes Urine stream starts and stops?: No Trouble starting stream?: No Do you have to strain to urinate?: No Blood in urine?: No Urinary tract infection?: No Sexually transmitted disease?: No Injury to kidneys or bladder?: No Painful intercourse?: No Weak stream?: No Currently pregnant?: No Vaginal bleeding?: No Last menstrual period?: N  Gastrointestinal Nausea?: Yes Vomiting?: No Indigestion/heartburn?: Yes Diarrhea?: No Constipation?: No  Constitutional Fever: No Night sweats?: No Weight loss?: No Fatigue?: Yes  Skin Skin rash/lesions?: No Itching?: No  Eyes Blurred vision?: No Double vision?: No  Ears/Nose/Throat Sore throat?: Yes Sinus problems?: No  Hematologic/Lymphatic Swollen glands?: No Easy bruising?: No  Cardiovascular Leg swelling?: No Chest pain?: No  Respiratory Cough?: No Shortness of breath?: No  Endocrine Excessive thirst?: No  Musculoskeletal Back pain?: No Joint pain?:  No  Neurological Headaches?: No Dizziness?: No  Psychologic Depression?: No Anxiety?: No  Physical Exam: BP 123/74 mmHg  Pulse 106  Ht 5\' 3"  (1.6 m)  Wt 178 lb (80.74 kg)  BMI 31.54 kg/m2  Constitutional: Well nourished. Alert and oriented, No acute distress. HEENT: [place] AT, moist mucus membranes. Trachea midline, no masses. Cardiovascular: No clubbing, cyanosis, or edema. Respiratory: Normal respiratory effort, no increased work of breathing. GI: Abdomen is soft, non tender, non distended, no abdominal masses. Liver and  spleen not palpable.  No hernias appreciated.  Stool sample for occult testing is not indicated.   GU: No CVA tenderness.  No bladder fullness or masses.  Atrophic external genitalia, normal pubic hair distribution, no lesions.  Normal urethral meatus, no lesions, no prolapse, no discharge.   No urethral masses, tenderness and/or tenderness. No bladder fullness, tenderness or masses. Normal vagina mucosa, good estrogen effect, no discharge, no lesions.  Patient had significant urine leakage with Valsalva.  Uterus and cervix are surgically absent.   No pelvic masses are palpated. Anus and perineum are without rashes or lesions.    Skin: No rashes, bruises or suspicious lesions. Lymph: No cervical or inguinal adenopathy. Neurologic: Grossly intact, no focal deficits, moving all 4 extremities. Psychiatric: Normal mood and affect.  Laboratory Data: Lab Results  Component Value Date   WBC 12.7* 09/09/2014   HGB 14.3 09/09/2014   HCT 43.2 09/09/2014   MCV 90.9 09/09/2014   PLT 332 09/09/2014    Lab Results  Component Value Date   CREATININE 0.84 09/09/2014   Urinalysis Results for orders placed or performed in visit on 04/25/15  Microscopic Examination  Result Value Ref Range   WBC, UA None seen 0 -  5 /hpf   RBC, UA 0-2 0 -  2 /hpf   Epithelial Cells (non renal) >10 (A) 0 - 10 /hpf   Renal Epithel, UA None seen None seen /hpf   Mucus, UA Present (A) Not  Estab.   Bacteria, UA None seen None seen/Few  Urinalysis, Complete  Result Value Ref Range   Specific Gravity, UA 1.015 1.005 - 1.030   pH, UA 8.5 (H) 5.0 - 7.5   Color, UA Yellow Yellow   Appearance Ur Clear Clear   Leukocytes, UA Negative Negative   Protein, UA Negative Negative/Trace   Glucose, UA Negative Negative   Ketones, UA Negative Negative   RBC, UA Negative Negative   Bilirubin, UA Negative Negative   Urobilinogen, Ur 1.0 0.2 - 1.0 mg/dL   Nitrite, UA Negative Negative   Microscopic Examination See below:      Pertinent Imaging: Results for LAVERLE, FARVER (MRN [id number]) as of 04/26/2015 20:31  Ref. Range 04/25/2015 11:16  Scan Result Unknown 105    Assessment & Plan:    1. Urinary frequency:   Patient with long-standing history of urinary frequency. Her residuals are somewhat, so I feel it may be due to incomplete bladder emptying.  She will be undergoing UDS in the future for further evaluation.    - Urinalysis, Complete - BLADDER SCAN AMB NON-IMAGING  2. SUI:   Patient definitely demonstrated incontinence during Valsalva.  Her urine literally shot across the room.  She will be seeing Dr. Matilde Sprang in the further after she completes her UDS.    3. Urge incontinence:  Patient does endorse some mild UI.  She will be undergoing UDS in the future.     Return for refer to [facility] for UDS.  These notes generated with voice recognition software. I apologize for typographical errors.  Zara Council, [facility] [address] [place], [place] [address] [phone]

## 2015-04-26 DIAGNOSIS — N393 Stress incontinence (female) (male): Secondary | ICD-10-CM | POA: Insufficient documentation

## 2015-04-26 DIAGNOSIS — N3941 Urge incontinence: Secondary | ICD-10-CM | POA: Insufficient documentation

## 2015-04-26 DIAGNOSIS — R350 Frequency of micturition: Secondary | ICD-10-CM | POA: Insufficient documentation

## 2015-05-20 ENCOUNTER — Other Ambulatory Visit: Payer: Self-pay | Admitting: Urology

## 2015-05-20 ENCOUNTER — Telehealth: Payer: Self-pay | Admitting: Obstetrics and Gynecology

## 2015-05-20 NOTE — Telephone Encounter (Signed)
Patient called today and said that when she had her UDS on the 28th it gave her a UTI. And she wanted to be seen and treated for this. She also said that she had an appointment for her results on Friday. At first i told her that she would probably have to come in for an office visit to have the UTI checked and because Ria Comment was not able to go over the UDS results she would still have to keep her appointment on the 10th. Before i could say anything else she started yelling at me and said to just forget the whole thing that we could not help her and then she hung up. I called Ria Comment and explained what was going on and she said it would be ok for the patient to just come in and drop off a urine and we would look at it and see if she did have a UTI and go from there without her having to be seen twice in one week. I called the patient back and she started yelling again and actually is out of town and said she couldn't come in anyway. She just wanted [place] to call something in for a UTI without [place] checking her urine. She said it was our fault that we sent her for a test that we knew was going to give her a UTI and now we were un willing to treat her for it. That there was nothing we could do for her and it was unfair of [place] to try and make her be seen twice in one week, even though i tried to tell her that she wasn't going to have to be seen but it didn't matter because she was out of town anyway's and wasn't even able to come in for an appointment. She just kept yelling so i just told her to have a good day and hung up. She will keep her appointment on Friday. I did advice her to seek treatment at an urgent care, she told me not to worry about it.  Thanks, Sharyn Lull

## 2015-05-21 NOTE — Telephone Encounter (Signed)
Patient called me back today and apologized for being rude to me and said that she had no right to speak to me that way and was truly sorry for the way she spoke to me. I told her it was ok and asked if there was anything we could do for her and she said no she had gotten treatment yesterday and that she would see [place] on Friday for her results. I think that was very nice of her to call back and say that. Just wanted to update you and her chart.    Thanks [employer]

## 2015-05-23 ENCOUNTER — Ambulatory Visit: Payer: BLUE CROSS/BLUE SHIELD | Admitting: Urology

## 2015-06-09 ENCOUNTER — Encounter: Payer: Self-pay | Admitting: Urology

## 2015-06-09 ENCOUNTER — Ambulatory Visit (INDEPENDENT_AMBULATORY_CARE_PROVIDER_SITE_OTHER): Payer: BLUE CROSS/BLUE SHIELD | Admitting: Urology

## 2015-06-09 VITALS — BP 128/76 | HR 72 | Ht 64.0 in | Wt 174.9 lb

## 2015-06-09 DIAGNOSIS — R350 Frequency of micturition: Secondary | ICD-10-CM

## 2015-06-09 DIAGNOSIS — N393 Stress incontinence (female) (male): Secondary | ICD-10-CM | POA: Diagnosis not present

## 2015-06-09 MED ORDER — MIRABEGRON ER 50 MG PO TB24: 50.0000 mg | ORAL_TABLET | Freq: Every day | ORAL | Status: DC

## 2015-06-09 NOTE — Progress Notes (Signed)
06/09/2015 3:43 PM   Erika Fischer 1960/10/16 [id number]  Referring provider: Albina Billet, MD [address]   [place], [place] [address]  Chief Complaint  Patient presents with  . Follow-up    Urodynamic Study     HPI: The patient was recently assessed by [name] and she has had urethral dilations. She has been on vaginal estrogen cream. She has stress incontinence and episodes of urge incontinence. She describes triggers such as running water and she had a postvoid residual 105 mL she had significant stress incontinence on [name]'s pelvic examination.  The patient has stress and urge incontinence and both are significant. She leaks with coughing and sneezing is sometimes bending and lifting. She denies enuresis. She wears 3 pads per day [name] what soaking. He can leak when she stands.  She voids every 1 hour and cannot sit 0 to are moving. She gets up 4 times at night. Her flow is reasonable. She generally does feel empty  She has no neurologic risk factors or symptoms. He's had a hysterectomy. Her bowel movements are normal. Her incontinence has not been medically treated  Modifying factors: There are no other modifying factors  Associated signs and symptoms: There are no other associated signs and symptoms Aggravating and relieving factors: There are no other aggravating or relieving factors Severity: Moderate Duration: Persistent  On urodynamics she voided 333 mL with a maximum flow 27 mils per second and she was catheterized with 20 mL. Her maximum capacity was 440 mL. Her bladder was unstable reaching a pressure of 7 cm water and she did not leak. At 100 mL her cough leak point pressure was 141 cm water with moderate leakage. At 300 mL her Valsalva leak point pressure was 85 cm water was mild to moderate. During voluntary voiding she voided 423 mL with a maximum flow 20 mL/s. Maximum voiding pressure was 16 cm of water and she emptied efficiently. EMG activity was normal.  Bladder neck consented to centimeters. She said she normally would not hold this amount. The details of the urodynamics or sinus dictate under damage note. the bladder neck dissented approximately 2 cm.  PMH: Past Medical History  Diagnosis Date  . Hypertension   . Tachycardia   . Kidney failure, acute ([place])   . HLD (hyperlipidemia)   . Acid reflux   . Urethral stricture   . Urinary incontinence   . Vaginal atrophy   . COPD (chronic obstructive pulmonary disease) ([place])   . Thrush     Surgical History: Past Surgical History  Procedure Laterality Date  . Abdominal hysterectomy      Home Medications:    Medication List       This list is accurate as of: 06/09/15  3:43 PM.  Always use your most recent med list.               albuterol 108 (90 Base) MCG/ACT inhaler  Commonly known as:  PROVENTIL HFA;VENTOLIN HFA  Inhale 2 puffs into the lungs every 6 (six) hours as needed for wheezing or shortness of breath.     chlorpheniramine-HYDROcodone 10-8 MG/5ML Suer  Commonly known as:  TUSSIONEX  Take 5 mLs by mouth every 12 (twelve) hours as needed for cough. Reported on 06/09/2015     citalopram 40 MG tablet  Commonly known as:  CELEXA  Take 40 mg by mouth daily.     diazepam 5 MG tablet  Commonly known as:  VALIUM  Take 1  tablet (5 mg total) by mouth every 8 (eight) hours as needed for muscle spasms.     DULERA 100-5 MCG/ACT Aero  Generic drug:  mometasone-formoterol  Reported on 06/09/2015     esomeprazole 40 MG capsule  Commonly known as:  NEXIUM  Take 40 mg by mouth daily. Reported on 04/25/2015     fluconazole 150 MG tablet  Commonly known as:  DIFLUCAN  Take 150 mg by mouth daily. Reported on 06/09/2015     guaiFENesin 600 MG 12 hr tablet  Commonly known as:  MUCINEX  Take 600 mg by mouth 2 (two) times daily as needed for cough or to loosen phlegm. Reported on 06/09/2015     hydrochlorothiazide 12.5 MG capsule  Commonly known as:  MICROZIDE  Take 12.5 mg by  mouth daily.     ibuprofen 200 MG tablet  Commonly known as:  ADVIL,MOTRIN  Take 400 mg by mouth every 4 (four) hours as needed for moderate pain. Reported on 06/09/2015     ibuprofen 800 MG tablet  Commonly known as:  ADVIL,MOTRIN  Take 1 tablet (800 mg total) by mouth every 8 (eight) hours as needed.     levofloxacin 500 MG tablet  Commonly known as:  LEVAQUIN  Take 500 mg by mouth daily. Reported on 06/09/2015     metoprolol succinate 100 MG 24 hr tablet  Commonly known as:  TOPROL-XL  Take 100 mg by mouth 2 (two) times daily. Reported on 06/09/2015     nystatin 100000 UNIT/ML suspension  Commonly known as:  MYCOSTATIN  Reported on 06/09/2015     omeprazole 40 MG capsule  Commonly known as:  PRILOSEC  Take 40 mg by mouth daily. Reported on 06/09/2015     polyethylene glycol packet  Commonly known as:  MIRALAX / GLYCOLAX  Take 17 g by mouth daily.     predniSONE 20 MG tablet  Commonly known as:  DELTASONE  Take 2 tablets (40 mg total) by mouth daily.     rosuvastatin 20 MG tablet  Commonly known as:  CRESTOR  Take 20 mg by mouth daily.     telmisartan 80 MG tablet  Commonly known as:  MICARDIS  Take 80 mg by mouth daily.     tiotropium 18 MCG inhalation capsule  Commonly known as:  [place] into inhaler and inhale.        Allergies:  Allergies  Allergen Reactions  . Ace Inhibitors Other (See Comments)  . Lisinopril Other (See Comments)    Other Reaction: OTHER REACTION  . Sulfa Antibiotics     Family History: Family History  Problem Relation Age of Onset  . Kidney disease Neg Hx   . Bladder Cancer Neg Hx     Social History:  reports that she has been smoking Cigarettes.  She has been smoking about 0.50 packs per day. She has never used smokeless tobacco. She reports that she does not drink alcohol or use illicit drugs.  ROS: UROLOGY Frequent Urination?: Yes Hard to postpone urination?: Yes Burning/pain with urination?: No Get up at night to  urinate?: Yes Leakage of urine?: Yes Urine stream starts and stops?: No Trouble starting stream?: No Do you have to strain to urinate?: No Blood in urine?: No Urinary tract infection?: No Sexually transmitted disease?: No Injury to kidneys or bladder?: No Painful intercourse?: No Weak stream?: No Currently pregnant?: No Vaginal bleeding?: No Last menstrual period?: No  Gastrointestinal Nausea?: Yes Vomiting?: No Indigestion/heartburn?: Yes Diarrhea?: No  Constipation?: No  Constitutional Fever: No Night sweats?: No Weight loss?: No Fatigue?: Yes  Skin Skin rash/lesions?: No Itching?: No  Eyes Blurred vision?: No Double vision?: No  Ears/Nose/Throat Sore throat?: No Sinus problems?: No  Hematologic/Lymphatic Swollen glands?: No Easy bruising?: No  Cardiovascular Leg swelling?: No Chest pain?: No  Respiratory Cough?: Yes Shortness of breath?: Yes  Endocrine Excessive thirst?: No  Musculoskeletal Back pain?: No Joint pain?: No  Neurological Headaches?: No Dizziness?: No  Psychologic Depression?: No Anxiety?: No  Physical Exam: BP 128/76 mmHg  Pulse 72  Ht 5\' 4"  (1.626 m)  Wt 174 lb 14.4 oz (79.334 kg)  BMI 30.01 kg/m2  Constitutional:  Alert and oriented, No acute distress. HEENT: [place] AT, moist mucus membranes.  Trachea midline, no masses. Cardiovascular: No clubbing, cyanosis, or edema. Respiratory: Normal respiratory effort, no increased work of breathing. GI: Abdomen is soft, nontender, nondistended, no abdominal masses GU: No CVA tenderness. The patient had mild grade 2 hyper mobility of the bladder neck and a positive cough test. She had no rectocele and a grade 1 cystocele. Skin: No rashes, bruises or suspicious lesions. Lymph: No cervical or inguinal adenopathy. Neurologic: Grossly intact, no focal deficits, moving all 4 extremities. Psychiatric: Normal mood and affect.  Laboratory Data: Lab Results  Component Value Date   WBC  12.7* 09/09/2014   HGB 14.3 09/09/2014   HCT 43.2 09/09/2014   MCV 90.9 09/09/2014   PLT 332 09/09/2014    Lab Results  Component Value Date   CREATININE 0.84 09/09/2014     Urinalysis    Component Value Date/Time   COLORURINE YELLOW* 09/09/2014 1051   APPEARANCEUR Clear 04/25/2015 1134   APPEARANCEUR CLEAR* 09/09/2014 1051   LABSPEC 1.020 09/09/2014 1051   PHURINE 5.0 09/09/2014 1051   GLUCOSEU Negative 04/25/2015 1134   HGBUR NEGATIVE 09/09/2014 1051   BILIRUBINUR Negative 04/25/2015 1134   BILIRUBINUR NEGATIVE 09/09/2014 1051   KETONESUR TRACE* 09/09/2014 1051   PROTEINUR Negative 04/25/2015 1134   PROTEINUR NEGATIVE 09/09/2014 1051   NITRITE Negative 04/25/2015 1134   NITRITE NEGATIVE 09/09/2014 1051   LEUKOCYTESUR Negative 04/25/2015 1134   LEUKOCYTESUR NEGATIVE 09/09/2014 1051    Pertinent Imaging: None  Assessment & Plan:  The patient has mixed stress and urge incontinence. She has hourly frequency and gets up 4 times at night.  The patient was recommended medical and behavioral therapy and I mentioned physical therapy. She did not reach her treatment goal surgery would be a good option for her to consider.  The patient will follow up in 1 month on the beta 3 agonists 50 mg. She did not want to see the physical therapy team  1. Mixed stress and urge incontinence 2. Urinary frequency   Reece Packer, MD  [facility] [address], [place], [place] [address] [phone]

## 2015-07-07 ENCOUNTER — Ambulatory Visit: Payer: BLUE CROSS/BLUE SHIELD | Admitting: Urology

## 2015-08-08 ENCOUNTER — Ambulatory Visit (INDEPENDENT_AMBULATORY_CARE_PROVIDER_SITE_OTHER): Payer: BLUE CROSS/BLUE SHIELD | Admitting: Urology

## 2015-08-08 ENCOUNTER — Encounter: Payer: Self-pay | Admitting: Urology

## 2015-08-08 VITALS — BP 130/84 | HR 69 | Ht 63.0 in | Wt 170.9 lb

## 2015-08-08 DIAGNOSIS — N3941 Urge incontinence: Secondary | ICD-10-CM

## 2015-08-08 DIAGNOSIS — N393 Stress incontinence (female) (male): Secondary | ICD-10-CM

## 2015-08-08 LAB — URINALYSIS, COMPLETE
BILIRUBIN UA: NEGATIVE
Glucose, UA: NEGATIVE
KETONES UA: NEGATIVE
LEUKOCYTES UA: NEGATIVE
Nitrite, UA: NEGATIVE
Protein, UA: NEGATIVE
Specific Gravity, UA: 1.015 (ref 1.005–1.030)
Urobilinogen, Ur: 0.2 mg/dL (ref 0.2–1.0)
pH, UA: 7.5 (ref 5.0–7.5)

## 2015-08-08 LAB — MICROSCOPIC EXAMINATION
Bacteria, UA: NONE SEEN
Epithelial Cells (non renal): NONE SEEN /hpf (ref 0–10)

## 2015-08-08 MED ORDER — FESOTERODINE FUMARATE ER 8 MG PO TB24: 8.0000 mg | ORAL_TABLET | Freq: Every day | ORAL | Status: DC

## 2015-08-08 MED ORDER — SOLIFENACIN SUCCINATE 5 MG PO TABS: 5.0000 mg | ORAL_TABLET | Freq: Every day | ORAL | Status: DC

## 2015-08-08 NOTE — Progress Notes (Unsigned)
08/08/2015 3:31 PM   Erika Fischer 1960-08-17 [id number]  Referring provider: Albina Billet, MD [address]   [place], [place] [address]  Chief Complaint  Patient presents with  . Follow-up    mixed stress and urge incontinence    HPI: The patient was recently assessed by [name] and she has had urethral dilations. She has been on vaginal estrogen cream. She has stress incontinence and episodes of urge incontinence. She describes triggers such as running water and she had a postvoid residual 105 mL she had significant stress incontinence on [name]'s pelvic examination.  The patient has stress and urge incontinence and both are significant. She leaks with coughing and sneezing is sometimes bending and lifting. She denies enuresis. She wears 3 pads per day [name] what soaking. He can leak when she stands.  She voids every 1 hour and cannot sit 0 to are moving. She gets up 4 times at night. Her flow is reasonable. She generally does feel empty  During urodynamics she had a low pressure unstable bladder with a capacity of 440 mL. At 300 mL her Valsalva leak for pressure was 85 cm of water mild to moderate. She had mild grade 2 hyper mobility of the bladder neck with a positive cough test.  She had mixed incontinence with hourly frequency and get up 4 times at night. I thought she did not reach her treatment goal with medication surgery was an option for her.     PMH: Past Medical History  Diagnosis Date  . Hypertension   . Tachycardia   . Kidney failure, acute ([place])   . HLD (hyperlipidemia)   . Acid reflux   . Urethral stricture   . Urinary incontinence   . Vaginal atrophy   . COPD (chronic obstructive pulmonary disease) ([place])   . Thrush     Surgical History: Past Surgical History  Procedure Laterality Date  . Abdominal hysterectomy      Home Medications:    Medication List       This list is accurate as of: 08/08/15  3:31 PM.  Always use your most recent med  list.               albuterol 108 (90 Base) MCG/ACT inhaler  Commonly known as:  PROVENTIL HFA;VENTOLIN HFA  Inhale 2 puffs into the lungs every 6 (six) hours as needed for wheezing or shortness of breath.     citalopram 40 MG tablet  Commonly known as:  CELEXA  Take 40 mg by mouth daily.     guaiFENesin 600 MG 12 hr tablet  Commonly known as:  MUCINEX  Take 600 mg by mouth 2 (two) times daily as needed for cough or to loosen phlegm. Reported on 08/08/2015     metoprolol succinate 100 MG 24 hr tablet  Commonly known as:  TOPROL-XL  Take 100 mg by mouth 2 (two) times daily. Reported on 06/09/2015     mirabegron ER 50 MG Tb24 tablet  Commonly known as:  MYRBETRIQ  Take 1 tablet (50 mg total) by mouth daily.     omeprazole 40 MG capsule  Commonly known as:  PRILOSEC  Take 40 mg by mouth daily. Reported on 08/08/2015     polyethylene glycol packet  Commonly known as:  MIRALAX / GLYCOLAX  Take 17 g by mouth daily.     rosuvastatin 20 MG tablet  Commonly known as:  CRESTOR  Take 20 mg by mouth daily.  telmisartan 80 MG tablet  Commonly known as:  MICARDIS  Take 80 mg by mouth daily.     theophylline 300 MG 12 hr tablet  Commonly known as:  THEODUR  Take 300 mg by mouth 2 (two) times daily.     tiotropium 18 MCG inhalation capsule  Commonly known as:  [place] into inhaler and inhale.        Allergies:  Allergies  Allergen Reactions  . Ace Inhibitors Other (See Comments)  . Lisinopril Other (See Comments)    Other Reaction: OTHER REACTION  . Sulfa Antibiotics     Family History: Family History  Problem Relation Age of Onset  . Kidney disease Neg Hx   . Bladder Cancer Neg Hx     Social History:  reports that she has been smoking Cigarettes.  She has been smoking about 0.50 packs per day. She has never used smokeless tobacco. She reports that she does not drink alcohol or use illicit drugs.  ROS: UROLOGY Frequent Urination?: Yes Hard to postpone  urination?: Yes Burning/pain with urination?: No Get up at night to urinate?: No Leakage of urine?: No Urine stream starts and stops?: No Trouble starting stream?: No Do you have to strain to urinate?: No Blood in urine?: No Urinary tract infection?: No Sexually transmitted disease?: No Injury to kidneys or bladder?: No Painful intercourse?: No Weak stream?: No Currently pregnant?: No Vaginal bleeding?: No Last menstrual period?: n  Gastrointestinal Nausea?: No Vomiting?: No Indigestion/heartburn?: No Diarrhea?: No Constipation?: No  Constitutional Fever: No Night sweats?: No Weight loss?: Yes Fatigue?: No  Skin Skin rash/lesions?: No Itching?: No  Eyes Blurred vision?: No Double vision?: No  Ears/Nose/Throat Sore throat?: No Sinus problems?: No  Hematologic/Lymphatic Swollen glands?: No Easy bruising?: No  Cardiovascular Leg swelling?: No Chest pain?: No  Respiratory Cough?: No Shortness of breath?: No  Endocrine Excessive thirst?: No  Musculoskeletal Back pain?: No Joint pain?: No  Neurological Headaches?: No Dizziness?: No  Psychologic Depression?: No Anxiety?: No  Physical Exam: BP 130/84 mmHg  Pulse 69  Ht 5\' 3"  (1.6 m)  Wt 170 lb 14.4 oz (77.52 kg)  BMI 30.28 kg/m2    Laboratory Data: Lab Results  Component Value Date   WBC 12.7* 09/09/2014   HGB 14.3 09/09/2014   HCT 43.2 09/09/2014   MCV 90.9 09/09/2014   PLT 332 09/09/2014    Lab Results  Component Value Date   CREATININE 0.84 09/09/2014    No results found for: PSA  No results found for: TESTOSTERONE  No results found for: HGBA1C  Urinalysis    Component Value Date/Time   COLORURINE YELLOW* 09/09/2014 1051   APPEARANCEUR Clear 04/25/2015 1134   APPEARANCEUR CLEAR* 09/09/2014 1051   LABSPEC 1.020 09/09/2014 1051   PHURINE 5.0 09/09/2014 1051   GLUCOSEU Negative 04/25/2015 1134   HGBUR NEGATIVE 09/09/2014 1051   BILIRUBINUR Negative 04/25/2015 1134    BILIRUBINUR NEGATIVE 09/09/2014 1051   KETONESUR TRACE* 09/09/2014 1051   PROTEINUR Negative 04/25/2015 1134   PROTEINUR NEGATIVE 09/09/2014 1051   NITRITE Negative 04/25/2015 1134   NITRITE NEGATIVE 09/09/2014 1051   LEUKOCYTESUR Negative 04/25/2015 1134   LEUKOCYTESUR NEGATIVE 09/09/2014 1051    Pertinent Imaging: None  Assessment & Plan:  The patient is 90% better on beta 3 agonist but unfortunately got a headache. This demonstrates treating a rectal bladder controls her overall clinical presentation. Vesicare 5 mg in total visit [place] sample temperature is given. Reassess 2 months  There  are no diagnoses linked to this encounter.  No Follow-up on file.  Reece Packer, MD  [facility] [address] [place], [place] [address] [phone]

## 2015-08-11 LAB — CULTURE, URINE COMPREHENSIVE

## 2015-09-29 ENCOUNTER — Ambulatory Visit: Payer: BLUE CROSS/BLUE SHIELD

## 2015-12-24 ENCOUNTER — Other Ambulatory Visit: Payer: Self-pay | Admitting: Internal Medicine

## 2015-12-24 DIAGNOSIS — Z1231 Encounter for screening mammogram for malignant neoplasm of breast: Secondary | ICD-10-CM

## 2016-01-26 ENCOUNTER — Ambulatory Visit
Admission: RE | Admit: 2016-01-26 | Discharge: 2016-01-26 | Disposition: A | Discharge status: Home / Self Care | Payer: BLUE CROSS/BLUE SHIELD | Source: Ambulatory Visit | Attending: Internal Medicine | Admitting: Internal Medicine

## 2016-01-26 DIAGNOSIS — Z1231 Encounter for screening mammogram for malignant neoplasm of breast: Secondary | ICD-10-CM | POA: Diagnosis not present

## 2016-06-17 ENCOUNTER — Other Ambulatory Visit: Payer: Self-pay | Admitting: Internal Medicine

## 2016-06-17 ENCOUNTER — Ambulatory Visit
Admission: RE | Admit: 2016-06-17 | Discharge: 2016-06-17 | Disposition: A | Discharge status: Home / Self Care | Payer: BLUE CROSS/BLUE SHIELD | Source: Ambulatory Visit | Attending: Internal Medicine | Admitting: Internal Medicine

## 2016-06-17 DIAGNOSIS — I700 Atherosclerosis of aorta: Secondary | ICD-10-CM | POA: Insufficient documentation

## 2016-06-17 DIAGNOSIS — M545 Low back pain: Secondary | ICD-10-CM

## 2016-06-17 DIAGNOSIS — M47896 Other spondylosis, lumbar region: Secondary | ICD-10-CM | POA: Insufficient documentation

## 2016-06-17 DIAGNOSIS — M47897 Other spondylosis, lumbosacral region: Secondary | ICD-10-CM | POA: Diagnosis not present

## 2018-04-03 ENCOUNTER — Other Ambulatory Visit: Payer: Self-pay | Admitting: Obstetrics and Gynecology

## 2018-04-03 ENCOUNTER — Other Ambulatory Visit: Payer: Self-pay | Admitting: Internal Medicine

## 2018-04-03 DIAGNOSIS — N644 Mastodynia: Secondary | ICD-10-CM

## 2018-04-11 ENCOUNTER — Ambulatory Visit
Admission: RE | Admit: 2018-04-11 | Discharge: 2018-04-11 | Disposition: A | Discharge status: Home / Self Care | Payer: Managed Care, Other (non HMO) | Source: Ambulatory Visit | Attending: Internal Medicine | Admitting: Internal Medicine

## 2018-04-11 ENCOUNTER — Other Ambulatory Visit: Payer: BLUE CROSS/BLUE SHIELD

## 2018-04-11 DIAGNOSIS — N644 Mastodynia: Secondary | ICD-10-CM | POA: Insufficient documentation

## 2019-07-02 ENCOUNTER — Ambulatory Visit: Admission: RE | Admit: 2019-07-02 | Payer: BLUE CROSS/BLUE SHIELD | Source: Home / Self Care

## 2019-07-02 ENCOUNTER — Other Ambulatory Visit: Payer: Self-pay

## 2020-11-06 ENCOUNTER — Other Ambulatory Visit: Payer: Self-pay | Admitting: General Surgery

## 2020-11-06 DIAGNOSIS — Z800 Family history of malignant neoplasm of digestive organs: Secondary | ICD-10-CM

## 2020-11-06 NOTE — Progress Notes (Signed)
Subjective:     Patient ID: Erika Fischer is a 60 y.o. female.   HPI   The following portions of the patient's history were reviewed and updated as appropriate.   This a new patient is here today for: office visit. She is here to discuss having a colonoscopy, last completed in 2018. She states her bowels move daily, no bleeding. She does admit to having a "sour stomach" with reflux of material tasting of rotten eggs/sulfur.  This may be clear or yellow bile.  Episodes of nocturnal reflux have occurred.  At times she vomits the material to obtain relief of epigastric discomfort.  Fairly rare.  This is a new symptom occurring twice in the last 2 months.  She does use Prilosec but doesn't feel it is helping as much as it has in the past.   Family history is notable that her father had Barrett's epithelial change late in life, dying at age 48 from dementia.   She has maternal cousin with brain cancer.  No family history of colon cancer.  Her mother has had breast cancer.  A maternal grandmother and maternal uncle of had pancreatic cancer and maternal aunt had both pancreatic and breast cancer at age 36.  No family genetic testing is been completed.  She is amenable to testing..   Review of Systems  Constitutional: Negative for chills and fever.  Respiratory: Negative for cough.       No chief complaint on file.     There were no vitals taken for this visit.       Past Medical History:  Diagnosis Date   Acute renal failure (CMS-HCC)     COPD (chronic obstructive pulmonary disease) (CMS-HCC)     Episodes of hot flashes for the last 12-14 years. 03/17/2011   Headaches 03/17/2011   History of GERD 03/17/2011   Hypercholesterolemia 03/17/2011   Hyperlipidemia     Hypertension     Palpitations 03/17/2011   Spells of weakness 03/17/2011   Tachycardia     Urethral stricture     Urinary incontinence     Vaginal atrophy             Past Surgical History:  Procedure Laterality Date    COLONOSCOPY   01/15/2013    Adenomatous Polyp, FH Colon Polyps (Mother): [facility] 01/2016; Recall Ltr mailed 11/20/2015 (dw)   COLONOSCOPY   04/13/2016    Sessile Serrated Adenoma, FH Colon Polyps (Mother): [facility] 03/2019   EGD   01/15/2013    No repeat per RTE   EGD   04/13/2016    Gastritis: No repeat per RTE   HYSTERECTOMY                    OB History     Gravida  0   Para  0   Term  0   Preterm  0   AB  0   Living  0      SAB  0   IAB  0   Ectopic  0   Molar  0   Multiple  0   Live Births  0        Obstetric Comments  Age at first period 69               Social History           Socioeconomic History   Marital status: Single  Occupational History   Occupation: Chartered loss adjuster [employer]  Tobacco Use  Smoking status: Current Some Day Smoker      Packs/day: 0.25   Smokeless tobacco: Never Used  Substance and Sexual Activity   Alcohol use: No      Comment: occasional ETOH use   Drug use: No   Sexual activity: Defer             Allergies  Allergen Reactions   Ace Inhibitors Cough and Other (See Comments)   Lisinopril Other (See Comments)      Other Reaction: OTHER REACTION   Lisinopril Other (See Comments)      Other Reaction: OTHER REACTION   Sulfa (Sulfonamide Antibiotics) Unknown      Current Medications        Current Outpatient Medications  Medication Sig Dispense Refill   albuterol 90 mcg/actuation inhaler Inhale 2 inhalations into the lungs every 6 (six) hours as needed       amLODIPine (NORVASC) 2.5 MG tablet Take 2.5 mg by mouth once daily       BREO ELLIPTA 100-25 mcg/dose DsDv inhaler Inhale 1 inhalation into the lungs once daily       citalopram (CELEXA) 40 MG tablet Take 40 mg by mouth daily.         CRESTOR 20 mg tablet Take 20 mg by mouth once daily   0   metoprolol succinate (TOPROL-XL) 100 MG XL tablet Take 100 mg by mouth 2 (two) times daily.         omeprazole (PRILOSEC) 40 MG DR capsule Take 40 mg by mouth once daily        telmisartan (MICARDIS) 80 MG tablet Take 80 mg by mouth daily.         esomeprazole (NEXIUM) 40 MG DR capsule Take 40 mg by mouth daily.   (Patient not taking: Reported on 11/06/2020)       hydrochlorothiazide (HYDRODIURIL) 12.5 MG tablet Take 12.5 mg by mouth daily.   (Patient not taking: Reported on 11/06/2020)       predniSONE (DELTASONE) 10 MG tablet Prednisone 10 mg, 4 pills q day x 2 days, 3 pills q day x 2 days, 2 pills q day x 2 days, 1 pill q day x 2 days. (Patient not taking: No sig reported) 20 tablet 0    No current facility-administered medications for this visit.             Family History  Problem Relation Age of Onset   Stroke Mother     Colon polyps Mother     Barrett's esophagus Father     Stroke Father     Heart disease Brother     Pancreatic cancer Maternal Grandmother     Pancreatic cancer Maternal Aunt     Breast cancer Maternal Aunt     Breast cancer Cousin          maternal first cousin   Pancreatic cancer Maternal Uncle     Colon cancer Neg Hx                 Objective:   Physical Exam Constitutional:      Appearance: Normal appearance.  Cardiovascular:     Rate and Rhythm: Normal rate and regular rhythm.     Pulses: Normal pulses.     Heart sounds: Normal heart sounds.  Pulmonary:     Effort: Pulmonary effort is normal.     Breath sounds: Normal breath sounds.  Musculoskeletal:     Cervical back: Neck supple.  Skin:  General: Skin is warm and dry.  Neurological:     Mental Status: She is alert and oriented to person, place, and time.  Psychiatric:        Mood and Affect: Mood normal.        Behavior: Behavior normal.      Labs and Radiology:    Colonoscopy polypectomy of April 13, 2016 showed a sessile serrated adenoma of the ascending colon.  Hyperplastic polyps were identified in the rectosigmoid and descending colon.  The ascending colon polyp was reported at 8 mm in diameter on the endoscopic report.   Upper endoscopy  biopsies from the same date showed minimal chronic gastritis without evidence of Helicobacter infection. The esophagus was reported as normal.   3-year follow-up recommended by the treating endoscopist.        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   Acceleration of reflux symptoms in spite of PPI.  Considering her family history of Barrett's esophageal change and her ongoing smoking, repeat EGD is indicated.    Plan:     A referral will be placed for genetic counseling and possible genetic testing based on her family history.   The patient was instructed in regards to preparation for the colonoscopy.  Considering her prior pathology results, benefits of the procedure greatly outweigh risks.  Repeat EGD is indicated based on clinical symptoms and family history.   This note is partially prepared by Karie Fetch, RN, acting as a scribe in the presence of Dr. Hervey Ard, MD.  The documentation recorded by the scribe accurately reflects the service I personally performed and the decisions made by me.    Robert Bellow, MD FACS

## 2020-11-18 ENCOUNTER — Inpatient Hospital Stay: Payer: Managed Care, Other (non HMO)

## 2020-11-18 ENCOUNTER — Inpatient Hospital Stay: Payer: Managed Care, Other (non HMO) | Admitting: Licensed Clinical Social Worker

## 2020-11-25 ENCOUNTER — Encounter: Payer: Self-pay | Admitting: General Surgery

## 2020-11-26 ENCOUNTER — Encounter
Admission: RE | Disposition: A | Discharge status: Home / Self Care | Payer: Self-pay | Source: Home / Self Care | Attending: General Surgery

## 2020-11-26 ENCOUNTER — Encounter: Payer: Self-pay | Admitting: General Surgery

## 2020-11-26 ENCOUNTER — Ambulatory Visit
Admission: RE | Admit: 2020-11-26 | Discharge: 2020-11-26 | Disposition: A | Discharge status: Home / Self Care | Payer: Managed Care, Other (non HMO) | Attending: General Surgery | Admitting: General Surgery

## 2020-11-26 ENCOUNTER — Ambulatory Visit: Payer: Managed Care, Other (non HMO) | Admitting: Anesthesiology

## 2020-11-26 DIAGNOSIS — Z7952 Long term (current) use of systemic steroids: Secondary | ICD-10-CM | POA: Diagnosis not present

## 2020-11-26 DIAGNOSIS — D120 Benign neoplasm of cecum: Secondary | ICD-10-CM | POA: Diagnosis not present

## 2020-11-26 DIAGNOSIS — K3189 Other diseases of stomach and duodenum: Secondary | ICD-10-CM | POA: Diagnosis not present

## 2020-11-26 DIAGNOSIS — Z888 Allergy status to other drugs, medicaments and biological substances status: Secondary | ICD-10-CM | POA: Insufficient documentation

## 2020-11-26 DIAGNOSIS — F17210 Nicotine dependence, cigarettes, uncomplicated: Secondary | ICD-10-CM | POA: Insufficient documentation

## 2020-11-26 DIAGNOSIS — Z1211 Encounter for screening for malignant neoplasm of colon: Secondary | ICD-10-CM | POA: Insufficient documentation

## 2020-11-26 DIAGNOSIS — K635 Polyp of colon: Secondary | ICD-10-CM | POA: Insufficient documentation

## 2020-11-26 DIAGNOSIS — Z882 Allergy status to sulfonamides status: Secondary | ICD-10-CM | POA: Insufficient documentation

## 2020-11-26 DIAGNOSIS — Z7951 Long term (current) use of inhaled steroids: Secondary | ICD-10-CM | POA: Insufficient documentation

## 2020-11-26 DIAGNOSIS — Z8379 Family history of other diseases of the digestive system: Secondary | ICD-10-CM | POA: Insufficient documentation

## 2020-11-26 DIAGNOSIS — K219 Gastro-esophageal reflux disease without esophagitis: Secondary | ICD-10-CM | POA: Insufficient documentation

## 2020-11-26 DIAGNOSIS — Z79899 Other long term (current) drug therapy: Secondary | ICD-10-CM | POA: Insufficient documentation

## 2020-11-26 DIAGNOSIS — Z86010 Personal history of colonic polyps: Secondary | ICD-10-CM | POA: Insufficient documentation

## 2020-11-26 DIAGNOSIS — K5730 Diverticulosis of large intestine without perforation or abscess without bleeding: Secondary | ICD-10-CM | POA: Insufficient documentation

## 2020-11-26 HISTORY — PX: COLONOSCOPY WITH PROPOFOL: SHX5780

## 2020-11-26 HISTORY — PX: ESOPHAGOGASTRODUODENOSCOPY (EGD) WITH PROPOFOL: SHX5813

## 2020-11-26 SURGERY — ESOPHAGOGASTRODUODENOSCOPY (EGD) WITH PROPOFOL
Anesthesia: General

## 2020-11-26 MED ORDER — GLYCOPYRROLATE 0.2 MG/ML IJ SOLN
INTRAMUSCULAR | Status: DC | PRN
  Administered 2020-11-26: .2 mg via INTRAVENOUS

## 2020-11-26 MED ORDER — SODIUM CHLORIDE 0.9 % IV SOLN
INTRAVENOUS | Status: DC
  Administered 2020-11-26: 20 mL/h via INTRAVENOUS

## 2020-11-26 MED ORDER — PROPOFOL 10 MG/ML IV BOLUS
INTRAVENOUS | Status: AC
  Filled 2020-11-26: qty 20

## 2020-11-26 MED ORDER — PROPOFOL 500 MG/50ML IV EMUL
INTRAVENOUS | Status: DC | PRN
  Administered 2020-11-26: 160 ug/kg/min via INTRAVENOUS

## 2020-11-26 MED ORDER — DEXMEDETOMIDINE HCL IN NACL 200 MCG/50ML IV SOLN
INTRAVENOUS | Status: AC
  Filled 2020-11-26: qty 50

## 2020-11-26 MED ORDER — ESMOLOL HCL 100 MG/10ML IV SOLN
INTRAVENOUS | Status: AC
  Filled 2020-11-26: qty 10

## 2020-11-26 MED ORDER — ESMOLOL HCL 100 MG/10ML IV SOLN
INTRAVENOUS | Status: DC | PRN
  Administered 2020-11-26: 20 mg via INTRAVENOUS

## 2020-11-26 MED ORDER — LIDOCAINE HCL (CARDIAC) PF 100 MG/5ML IV SOSY
PREFILLED_SYRINGE | INTRAVENOUS | Status: DC | PRN
  Administered 2020-11-26: 100 mg via INTRAVENOUS

## 2020-11-26 MED ORDER — PROPOFOL 10 MG/ML IV BOLUS
INTRAVENOUS | Status: DC | PRN
  Administered 2020-11-26: 10 mg via INTRAVENOUS
  Administered 2020-11-26: 70 mg via INTRAVENOUS
  Administered 2020-11-26 (×2): 10 mg via INTRAVENOUS

## 2020-11-26 MED ORDER — DEXMEDETOMIDINE HCL IN NACL 200 MCG/50ML IV SOLN
INTRAVENOUS | Status: DC | PRN
  Administered 2020-11-26 (×2): 12 ug via INTRAVENOUS

## 2020-11-26 NOTE — H&P (Addendum)
Erika Fischer [id number] 1960/04/13     HPI:  60 y/o with symptoms of worsening GERD and past history of SSA of colon. For repeat endoscopy.  She tolerated the prep well.   Medications Prior to Admission  Medication Sig Dispense Refill Last Dose   albuterol (PROVENTIL HFA;VENTOLIN HFA) 108 (90 BASE) MCG/ACT inhaler Inhale 2 puffs into the lungs every 6 (six) hours as needed for wheezing or shortness of breath. 1 Inhaler 2 11/25/2020   amLODipine (NORVASC) 2.5 MG tablet Take 2.5 mg by mouth daily.   11/25/2020   citalopram (CELEXA) 40 MG tablet Take 40 mg by mouth daily.   11/25/2020   fesoterodine (TOVIAZ) 8 MG TB24 tablet Take 1 tablet (8 mg total) by mouth daily. 30 tablet 11 11/25/2020   fluticasone furoate-vilanterol (BREO ELLIPTA) 100-25 MCG/INH AEPB Inhale 1 puff into the lungs daily.   11/25/2020   guaiFENesin (MUCINEX) 600 MG 12 hr tablet Take 600 mg by mouth 2 (two) times daily as needed for cough or to loosen phlegm. Reported on 08/08/2015   11/25/2020   hydrochlorothiazide (HYDRODIURIL) 12.5 MG tablet Take 12.5 mg by mouth daily.   11/25/2020   metoprolol succinate (TOPROL-XL) 100 MG 24 hr tablet Take 100 mg by mouth 2 (two) times daily. Reported on 06/09/2015   11/25/2020   omeprazole (PRILOSEC) 40 MG capsule Take 40 mg by mouth daily. Reported on 08/08/2015  0 11/25/2020   predniSONE (DELTASONE) 10 MG tablet Take 10 mg by mouth daily with breakfast.   11/25/2020   rosuvastatin (CRESTOR) 20 MG tablet Take 20 mg by mouth daily.   11/25/2020   solifenacin (VESICARE) 5 MG tablet Take 1 tablet (5 mg total) by mouth daily. 30 tablet 11 11/25/2020   telmisartan (MICARDIS) 80 MG tablet Take 80 mg by mouth daily.   11/25/2020   theophylline (THEODUR) 300 MG 12 hr tablet Take 300 mg by mouth 2 (two) times daily.   11/25/2020   mirabegron ER (MYRBETRIQ) 50 MG TB24 tablet Take 1 tablet (50 mg total) by mouth daily. (Patient not taking: Reported on 08/08/2015) 30 tablet 11    polyethylene glycol (MIRALAX /  GLYCOLAX) packet Take 17 g by mouth daily. (Patient not taking: Reported on 06/09/2015) 14 each 0    tiotropium (SPIRIVA) 18 MCG inhalation capsule Place into inhaler and inhale.      Allergies  Allergen Reactions   Ace Inhibitors Other (See Comments)   Lisinopril Other (See Comments)    Other Reaction: OTHER REACTION   Sulfa Antibiotics    Past Medical History:  Diagnosis Date   Acid reflux    COPD (chronic obstructive pulmonary disease) (HCC)    HLD (hyperlipidemia)    Hypertension    Kidney failure, acute (HCC)    Tachycardia    Thrush    Urethral stricture    Urinary incontinence    Vaginal atrophy    Past Surgical History:  Procedure Laterality Date   ABDOMINAL HYSTERECTOMY     Social History   Socioeconomic History   Marital status: Married    Spouse name: Not on file   Number of children: Not on file   Years of education: Not on file   Highest education level: Not on file  Occupational History   Not on file  Tobacco Use   Smoking status: Every Day    Packs/day: 0.50    Types: Cigarettes   Smokeless tobacco: Never  Substance and Sexual Activity   Alcohol use: No  Drug use: No   Sexual activity: Not on file  Other Topics Concern   Not on file  Social History Narrative   Not on file   Social Determinants of Health   Financial Resource Strain: Not on file  Food Insecurity: Not on file  Transportation Needs: Not on file  Physical Activity: Not on file  Stress: Not on file  Social Connections: Not on file  Intimate Partner Violence: Not on file   Social History   Social History Narrative   Not on file     ROS: Negative.  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  Acceleration of reflux symptoms in spite of PPI. Considering her family history of Barrett's esophageal change and her ongoing smoking, repeat EGD is indicated.  PE: HEENT: Negative. Lungs: Clear. Cardio:  RR.  Assessment/Plan:  Proceed with planned endoscopy.   Forest Gleason [facility] 11/26/2020

## 2020-11-26 NOTE — Op Note (Signed)
[facility] Gastroenterology Patient Name: Erika Fischer Procedure Date: 11/26/2020 9:28 AM MRN: [id number] Account #: [id number] Date of Birth: Nov 08, 1960 Admit Type: Outpatient Age: 60 Room: [facility] ENDO ROOM 1 Gender: Female Note Status: Finalized Instrument Name: Peds Colonoscope [id number] Procedure:             Colonoscopy Indications:           High risk colon cancer surveillance: Personal history                         of colonic polyps Providers:             Robert Bellow, MD Medicines:             Propofol per Anesthesia Complications:         No immediate complications. Procedure:             Pre-Anesthesia Assessment:                        - Prior to the procedure, a History and Physical was                         performed, and patient medications, allergies and                         sensitivities were reviewed. The patient's tolerance                         of previous anesthesia was reviewed.                        - The risks and benefits of the procedure and the                         sedation options and risks were discussed with the                         patient. All questions were answered and informed                         consent was obtained.                        After obtaining informed consent, the colonoscope was                         passed under direct vision. Throughout the procedure,                         the patient's blood pressure, pulse, and oxygen                         saturations were monitored continuously. The                         Colonoscope was introduced through the anus and                         advanced to the the cecum, identified by appendiceal  orifice and ileocecal valve. The colonoscopy was                         somewhat difficult due to significant looping.                         Successful completion of the procedure was aided by                         changing the  patient to a supine position. The patient                         tolerated the procedure well. The quality of the bowel                         preparation was adequate to identify polyps. Findings:      Many medium-mouthed diverticula were found in the sigmoid colon.      Two sessile polyps were found in the sigmoid colon and cecum. The polyps       were 5 mm in size. These were biopsied with a cold large-capacity       forceps for histology.      The retroflexed view of the distal rectum and anal verge was normal and       showed no anal or rectal abnormalities.      Hypertrophic musculature in the sigmoid colon. Impression:            - Diverticulosis in the sigmoid colon.                        - Two 5 mm polyps in the sigmoid colon and in the                         cecum. Biopsied.                        - The distal rectum and anal verge are normal on                         retroflexion view. Recommendation:        - Telephone endoscopist for pathology results in 1                         week. Procedure Code(s):     --- Professional ---                        [phone], Colonoscopy, flexible; with biopsy, single or                         multiple Diagnosis Code(s):     --- Professional ---                        Z86.010, Personal history of colonic polyps                        K63.5, Polyp of colon                        K57.30, Diverticulosis  of large intestine without                         perforation or abscess without bleeding CPT copyright 2019 American [employer] Association. All rights reserved. The codes documented in this report are preliminary and upon coder review may  be revised to meet current compliance requirements. Robert Bellow, MD 11/26/2020 10:30:07 AM This report has been signed electronically. Number of Addenda: 0 Note Initiated On: 11/26/2020 9:28 AM Scope Withdrawal Time: 0 hours 12 minutes 26 seconds  Total Procedure Duration: 0 hours 32 minutes 40  seconds  Estimated Blood Loss:  Estimated blood loss was minimal.      [facility]

## 2020-11-26 NOTE — Op Note (Signed)
[facility] Gastroenterology Patient Name: Erika Fischer Procedure Date: 11/26/2020 9:29 AM MRN: [id number] Account #: [id number] Date of Birth: 08/23/1960 Admit Type: Outpatient Age: 60 Room: [facility] ENDO ROOM 1 Gender: Female Note Status: Finalized Instrument Name: Upper Endoscope [id number] Procedure:             Upper GI endoscopy Indications:           Suspected esophageal reflux Providers:             Robert Bellow, MD Medicines:             Propofol per Anesthesia Complications:         No immediate complications. Procedure:             Pre-Anesthesia Assessment:                        - Prior to the procedure, a History and Physical was                         performed, and patient medications, allergies and                         sensitivities were reviewed. The patient's tolerance                         of previous anesthesia was reviewed.                        - The risks and benefits of the procedure and the                         sedation options and risks were discussed with the                         patient. All questions were answered and informed                         consent was obtained.                        After obtaining informed consent, the endoscope was                         passed under direct vision. Throughout the procedure,                         the patient's blood pressure, pulse, and oxygen                         saturations were monitored continuously. The Endoscope                         was introduced through the mouth, and advanced to the                         third part of duodenum. The upper GI endoscopy was                         accomplished without difficulty. The patient tolerated  the procedure well. Findings:      The examined esophagus was normal. Biopsies were taken with a cold       forceps for histology. Biopsies at 38 cm for family history of Barrett's      The examined  duodenum was normal.      Diffuse minimal inflammation was found at the pylorus. Impression:            - Normal esophagus. Biopsied.                        - Normal stomach.                        - Normal examined duodenum. Recommendation:        - Telephone endoscopist for pathology results in 1                         week. Procedure Code(s):     --- Professional ---                        [phone], Esophagogastroduodenoscopy, flexible,                         transoral; with biopsy, single or multiple CPT copyright 2019 American Medical Association. All rights reserved. The codes documented in this report are preliminary and upon coder review may  be revised to meet current compliance requirements. Robert Bellow, MD 11/26/2020 9:52:05 AM This report has been signed electronically. Number of Addenda: 0 Note Initiated On: 11/26/2020 9:29 AM Estimated Blood Loss:  Estimated blood loss: none.      [facility]

## 2020-11-26 NOTE — Anesthesia Preprocedure Evaluation (Signed)
Anesthesia Evaluation  Patient identified by MRN, date of birth, ID band Patient awake    Reviewed: Allergy & Precautions, NPO status , Patient's Chart, lab work & pertinent test results  History of Anesthesia Complications Negative for: history of anesthetic complications  Airway Mallampati: III  TM Distance: <3 FB Neck ROM: full    Dental  (+) Chipped, Poor Dentition, Missing   Pulmonary COPD, Current Smoker,    Pulmonary exam normal        Cardiovascular Exercise Tolerance: Good hypertension, (-) angina(-) Past MI and (-) DOE Normal cardiovascular exam     Neuro/Psych  Headaches, negative psych ROS   GI/Hepatic Neg liver ROS, GERD  Medicated and Controlled,  Endo/Other  negative endocrine ROS  Renal/GU Renal disease  negative genitourinary   Musculoskeletal   Abdominal   Peds  Hematology negative hematology ROS (+)   Anesthesia Other Findings Past Medical History: No date: Acid reflux No date: COPD (chronic obstructive pulmonary disease) (HCC) No date: HLD (hyperlipidemia) No date: Hypertension No date: Kidney failure, acute ([place]) No date: Tachycardia No date: Thrush No date: Urethral stricture No date: Urinary incontinence No date: Vaginal atrophy  Past Surgical History: No date: ABDOMINAL HYSTERECTOMY  BMI    Body Mass Index: 27.46 kg/m      Reproductive/Obstetrics negative OB ROS                             Anesthesia Physical Anesthesia Plan  ASA: 3  Anesthesia Plan: General   Post-op Pain Management:    Induction: Intravenous  PONV Risk Score and Plan: Propofol infusion and TIVA  Airway Management Planned: Natural Airway and Nasal Cannula  Additional Equipment:   Intra-op Plan:   Post-operative Plan:   Informed Consent: I have reviewed the patients History and Physical, chart, labs and discussed the procedure including the risks, benefits and  alternatives for the proposed anesthesia with the patient or authorized representative who has indicated his/her understanding and acceptance.     Dental Advisory Given  Plan Discussed with: Anesthesiologist, CRNA and Surgeon  Anesthesia Plan Comments: (Patient consented for risks of anesthesia including but not limited to:  - adverse reactions to medications - risk of airway placement if required - damage to eyes, teeth, lips or other oral mucosa - nerve damage due to positioning  - sore throat or hoarseness - Damage to heart, brain, nerves, lungs, other parts of body or loss of life  Patient voiced understanding.)        Anesthesia Quick Evaluation

## 2020-11-26 NOTE — Anesthesia Postprocedure Evaluation (Signed)
Anesthesia Post Note  Patient: Erika Fischer  Procedure(s) Performed: ESOPHAGOGASTRODUODENOSCOPY (EGD) WITH PROPOFOL COLONOSCOPY WITH PROPOFOL  Patient location during evaluation: Endoscopy Anesthesia Type: General Level of consciousness: awake and alert Pain management: pain level controlled Vital Signs Assessment: post-procedure vital signs reviewed and stable Respiratory status: spontaneous breathing, nonlabored ventilation, respiratory function stable and patient connected to nasal cannula oxygen Cardiovascular status: blood pressure returned to baseline and stable Postop Assessment: no apparent nausea or vomiting Anesthetic complications: no   No notable events documented.   Last Vitals:  Vitals:   11/26/20 1050 11/26/20 1100  BP: 138/76 139/70  Pulse: 76 73  Resp: 16 18  Temp:    SpO2: 100% 100%    Last Pain:  Vitals:   11/26/20 1100  TempSrc:   PainSc: 0-No pain                 Precious Haws Eidan Muellner

## 2020-11-26 NOTE — Transfer of Care (Signed)
Immediate Anesthesia Transfer of Care Note  Patient: Erika Fischer  Procedure(s) Performed: ESOPHAGOGASTRODUODENOSCOPY (EGD) WITH PROPOFOL COLONOSCOPY WITH PROPOFOL  Patient Location: PACU  Anesthesia Type:General  Level of Consciousness: awake, alert  and oriented  Airway & Oxygen Therapy: Patient Spontanous Breathing  Post-op Assessment: Report given to RN and Post -op Vital signs reviewed and stable  Post vital signs: Reviewed and stable  Last Vitals:  Vitals Value Taken Time  BP 139/79 11/26/20 1031  Temp    Pulse 88 11/26/20 1031  Resp 18 11/26/20 1031  SpO2 100 % 11/26/20 1031  Vitals shown include unvalidated device data.  Last Pain:  Vitals:   11/26/20 0908  TempSrc: Temporal  PainSc: 0-No pain         Complications: No notable events documented.

## 2020-11-27 ENCOUNTER — Encounter: Payer: Self-pay | Admitting: General Surgery

## 2020-11-27 LAB — SURGICAL PATHOLOGY

## 2020-12-22 ENCOUNTER — Other Ambulatory Visit: Payer: Self-pay | Admitting: Internal Medicine

## 2020-12-22 DIAGNOSIS — Z1231 Encounter for screening mammogram for malignant neoplasm of breast: Secondary | ICD-10-CM

## 2020-12-29 ENCOUNTER — Ambulatory Visit
Admission: RE | Admit: 2020-12-29 | Discharge: 2020-12-29 | Disposition: A | Discharge status: Home / Self Care | Payer: Managed Care, Other (non HMO) | Source: Ambulatory Visit | Attending: Internal Medicine | Admitting: Internal Medicine

## 2020-12-29 ENCOUNTER — Other Ambulatory Visit: Payer: Self-pay

## 2020-12-29 DIAGNOSIS — Z1231 Encounter for screening mammogram for malignant neoplasm of breast: Secondary | ICD-10-CM | POA: Insufficient documentation

## 2021-06-05 ENCOUNTER — Other Ambulatory Visit: Payer: Self-pay | Admitting: *Deleted

## 2021-06-05 DIAGNOSIS — F17210 Nicotine dependence, cigarettes, uncomplicated: Secondary | ICD-10-CM

## 2021-06-05 DIAGNOSIS — Z87891 Personal history of nicotine dependence: Secondary | ICD-10-CM

## 2021-06-16 ENCOUNTER — Encounter: Payer: Managed Care, Other (non HMO) | Admitting: Acute Care

## 2021-06-18 ENCOUNTER — Encounter: Payer: Self-pay | Admitting: Acute Care

## 2021-06-18 ENCOUNTER — Ambulatory Visit (INDEPENDENT_AMBULATORY_CARE_PROVIDER_SITE_OTHER): Payer: Managed Care, Other (non HMO) | Admitting: Acute Care

## 2021-06-18 DIAGNOSIS — F17210 Nicotine dependence, cigarettes, uncomplicated: Secondary | ICD-10-CM

## 2021-06-18 NOTE — Patient Instructions (Signed)
Thank you for participating in the [facility]. It was our pleasure to meet you today. We will call you with the results of your scan within the next few days. Your scan will be assigned a Lung RADS category score by the physicians reading the scans.  This Lung RADS score determines follow up scanning.  See below for description of categories, and follow up screening recommendations. We will be in touch to schedule your follow up screening annually or based on recommendations of our providers. We will fax a copy of your scan results to your Primary Care Physician, or the physician who referred you to the program, to ensure they have the results. Please call the office if you have any questions or concerns regarding your scanning experience or results.  Our office number is [phone]. Please speak with Denise Phelps, RN. , or  Denise Buckner RN, They are  our Lung Cancer Screening RN.'s If They are unavailable when you call, Please leave a message on the voice mail. We will return your call at our earliest convenience.This voice mail is monitored several times a day.  Remember, if your scan is normal, we will scan you annually as long as you continue to meet the criteria for the program. (Age 55-77, Current smoker or smoker who has quit within the last 15 years). If you are a smoker, remember, quitting is the single most powerful action that you can take to decrease your risk of lung cancer and other pulmonary, breathing related problems. We know quitting is hard, and we are here to help.  Please let us know if there is anything we can do to help you meet your goal of quitting. If you are a former smoker, congratulations. We are proud of you! Remain smoke free! Remember you can refer friends or family members through the number above.  We will screen them to make sure they meet criteria for the program. Thank you for helping us take better care of you by  participating in Lung Screening.  You can receive free nicotine replacement therapy ( patches, gum or mints) by calling 1-800-QUIT NOW. Please call so we can get you on the path to becoming  a non-smoker. I know it is hard, but you can do this!  Lung RADS Categories:  Lung RADS 1: no nodules or definitely non-concerning nodules.  Recommendation is for a repeat annual scan in 12 months.  Lung RADS 2:  nodules that are non-concerning in appearance and behavior with a very low likelihood of becoming an active cancer. Recommendation is for a repeat annual scan in 12 months.  Lung RADS 3: nodules that are probably non-concerning , includes nodules with a low likelihood of becoming an active cancer.  Recommendation is for a 6-month repeat screening scan. Often noted after an upper respiratory illness. We will be in touch to make sure you have no questions, and to schedule your 6-month scan.  Lung RADS 4 A: nodules with concerning findings, recommendation is most often for a follow up scan in 3 months or additional testing based on our provider's assessment of the scan. We will be in touch to make sure you have no questions and to schedule the recommended 3 month follow up scan.  Lung RADS 4 B:  indicates findings that are concerning. We will be in touch with you to schedule additional diagnostic testing based on our provider's  assessment of the scan.  Other options for assistance in smoking cessation (   As covered by your insurance benefits)  Hypnosis for smoking cessation  [employer] [phone]  Acupuncture for smoking cessation  [facility] [phone]   

## 2021-06-18 NOTE — Progress Notes (Signed)
Virtual Visit via Telephone Note ? ?I connected with Mikeal Hawthorne on 06/18/21 at  1:30 PM EDT by telephone and verified that I am speaking with the correct person using two identifiers. ? ?Location: ?Patient:  At home ?Provider:  [place], [place], [place], [address]  ?  ?I discussed the limitations, risks, security and privacy concerns of performing an evaluation and management service by telephone and the availability of in person appointments. I also discussed with the patient that there may be a patient responsible charge related to this service. The patient expressed understanding and agreed to proceed. ? ? ? ?Shared Decision Making Visit Lung Cancer Screening Program ?([phone]) ? ? ?Eligibility: ?Age 61 y.o. ?Pack Years Smoking History Calculation 28.5 pack year smoking history ?(# packs/per year x # years smoked) ?Recent History of coughing up blood  no ?Unexplained weight loss? no ?( >Than 15 pounds within the last 6 months ) ?Prior History Lung / other cancer no ?(Diagnosis within the last 5 years already requiring surveillance chest CT Scans). ?Smoking Status Current Smoker ?Former Smokers: Years since quit:  NA ? Quit Date:  NA ? ?Visit Components: ?Discussion included one or more decision making aids. yes ?Discussion included risk/benefits of screening. yes ?Discussion included potential follow up diagnostic testing for abnormal scans. yes ?Discussion included meaning and risk of over diagnosis. yes ?Discussion included meaning and risk of False Positives. yes ?Discussion included meaning of total radiation exposure. yes ? ?Counseling Included: ?Importance of adherence to annual lung cancer LDCT screening. yes ?Impact of comorbidities on ability to participate in the program. yes ?Ability and willingness to under diagnostic treatment. yes ? ?Smoking Cessation Counseling: ?Current Smokers:  ?Discussed importance of smoking cessation. yes ?Information about tobacco cessation classes and  interventions provided to patient. yes ?Patient provided with "ticket" for LDCT Scan. yes ?Symptomatic Patient. no ? Counseling NA ?Diagnosis Code: Tobacco Use Z72.0 ?Asymptomatic Patient yes ? Counseling (Intermediate counseling: > three minutes counseling) Z5638 ?Former Smokers:  ?Discussed the importance of maintaining cigarette abstinence. yes ?Diagnosis Code: Personal History of Nicotine Dependence. V56.433 ?Information about tobacco cessation classes and interventions provided to patient. Yes ?Patient provided with "ticket" for LDCT Scan. yes ?Written Order for Lung Cancer Screening with LDCT placed in Epic. Yes ?(CT Chest Lung Cancer Screening Low Dose W/O CM) [id number] ?Z12.2-Screening of respiratory organs ?Z87.891-Personal history of nicotine dependence ? ?I have spent 25 minutes of face to face/ virtual visit   time with  Ms. Cudmore discussing the risks and benefits of lung cancer screening. We viewed / discussed a power point together that explained in detail the above noted topics. We paused at intervals to allow for questions to be asked and answered to ensure understanding.We discussed that the single most powerful action that she can take to decrease her risk of developing lung cancer is to quit smoking. We discussed whether or not she is ready to commit to setting a quit date. We discussed options for tools to aid in quitting smoking including nicotine replacement therapy, non-nicotine medications, support groups, Quit Smart classes, and behavior modification. We discussed that often times setting smaller, more achievable goals, such as eliminating 1 cigarette a day for a week and then 2 cigarettes a day for a week can be helpful in slowly decreasing the number of cigarettes smoked. This allows for a sense of accomplishment as well as providing a clinical benefit. I provided  her  with smoking cessation  information  with contact information for community  resources, classes, free nicotine replacement  therapy, and access to mobile apps, text messaging, and on-line smoking cessation help. I have also provided  her  the office contact information in the event she needs to contact me, or the screening staff. We discussed the time and location of the scan, and that either Doroteo Glassman RN, Joella Prince, RN  or I will call / send a letter with the results within 24-72 hours of receiving them. The patient verbalized understanding of all of  the above and had no further questions upon leaving the office. They have my contact information in the event they have any further questions. ? ?I spent 3 minutes counseling on smoking cessation and the health risks of continued tobacco abuse. ? ?I explained to the patient that there has been a high incidence of coronary artery disease noted on these exams. I explained that this is a non-gated exam therefore degree or severity cannot be determined. This patient is on statin therapy. I have asked the patient to follow-up with their PCP regarding any incidental finding of coronary artery disease and management with diet or medication as their PCP  feels is clinically indicated. The patient verbalized understanding of the above and had no further questions upon completion of the visit ? ?Screening scan is scheduled for 06/22/2021 at 3 pm at the [facility] ? ?  ? ? ?Magdalen Spatz, NP ?06/18/2021 ? ? ? ? ? ? ?

## 2021-06-19 ENCOUNTER — Encounter: Payer: Managed Care, Other (non HMO) | Admitting: Acute Care

## 2021-06-22 ENCOUNTER — Ambulatory Visit: Admission: RE | Admit: 2021-06-22 | Payer: Managed Care, Other (non HMO) | Source: Ambulatory Visit

## 2021-06-24 ENCOUNTER — Telehealth: Payer: Self-pay | Admitting: *Deleted

## 2021-06-24 NOTE — Telephone Encounter (Signed)
Left message for pt to call back to reschedule lung screening CT scan.  ?

## 2021-07-07 ENCOUNTER — Ambulatory Visit
Admission: RE | Admit: 2021-07-07 | Discharge: 2021-07-07 | Disposition: A | Discharge status: Home / Self Care | Payer: Managed Care, Other (non HMO) | Source: Ambulatory Visit | Attending: Acute Care | Admitting: Acute Care

## 2021-07-07 DIAGNOSIS — F17210 Nicotine dependence, cigarettes, uncomplicated: Secondary | ICD-10-CM | POA: Insufficient documentation

## 2021-07-07 DIAGNOSIS — Z87891 Personal history of nicotine dependence: Secondary | ICD-10-CM | POA: Insufficient documentation

## 2021-07-09 ENCOUNTER — Other Ambulatory Visit: Payer: Self-pay

## 2021-07-09 DIAGNOSIS — Z87891 Personal history of nicotine dependence: Secondary | ICD-10-CM

## 2021-07-09 DIAGNOSIS — F17210 Nicotine dependence, cigarettes, uncomplicated: Secondary | ICD-10-CM

## 2021-10-16 ENCOUNTER — Encounter: Payer: Self-pay | Admitting: Urology

## 2021-10-16 ENCOUNTER — Ambulatory Visit: Payer: Managed Care, Other (non HMO) | Admitting: Urology

## 2021-10-16 ENCOUNTER — Other Ambulatory Visit: Payer: Self-pay | Admitting: Urology

## 2021-10-16 VITALS — BP 125/69 | HR 66 | Ht 63.0 in | Wt 153.0 lb

## 2021-10-16 DIAGNOSIS — R300 Dysuria: Secondary | ICD-10-CM

## 2021-10-16 DIAGNOSIS — R3129 Other microscopic hematuria: Secondary | ICD-10-CM

## 2021-10-16 DIAGNOSIS — N3941 Urge incontinence: Secondary | ICD-10-CM | POA: Diagnosis not present

## 2021-10-16 DIAGNOSIS — N3281 Overactive bladder: Secondary | ICD-10-CM | POA: Diagnosis not present

## 2021-10-16 LAB — URINALYSIS, COMPLETE
Bilirubin, UA: NEGATIVE
Glucose, UA: NEGATIVE
Ketones, UA: NEGATIVE
Leukocytes,UA: NEGATIVE
Nitrite, UA: NEGATIVE
Protein,UA: NEGATIVE
Specific Gravity, UA: 1.01 (ref 1.005–1.030)
Urobilinogen, Ur: 0.2 mg/dL (ref 0.2–1.0)
pH, UA: 6.5 (ref 5.0–7.5)

## 2021-10-16 LAB — MICROSCOPIC EXAMINATION: Bacteria, UA: NONE SEEN

## 2021-10-16 MED ORDER — GEMTESA 75 MG PO TABS: 75.0000 mg | ORAL_TABLET | Freq: Every day | ORAL | 0 refills | Status: DC

## 2021-10-16 NOTE — Progress Notes (Signed)
10/16/2021 10:18 AM   Erika Fischer 15-May-1960 [id number]  Referring provider: No referring provider defined for this encounter.  Chief Complaint  Patient presents with   Dysuria    HPI: Erika Fischer is a 61 y.o. female who presents for evaluation of dysuria.  Previously seen by Zara Council and Dr. Matilde Sprang for overactive bladder.  Last seen in 2017. Presents with a 42-monthhistory of burning sensation after urination and pelvic discomfort Still with OAB symptoms including frequency, urgency and urge incontinence.  Had been on Myrbetriq though states she had elevated blood pressure and Solifenacin.  Currently taking no OAB meds Denies gross hematuria   PMH: Past Medical History:  Diagnosis Date   Acid reflux    COPD (chronic obstructive pulmonary disease) (HCC)    HLD (hyperlipidemia)    Hypertension    Kidney failure, acute (HCC)    Tachycardia    Thrush    Urethral stricture    Urinary incontinence    Vaginal atrophy     Surgical History: Past Surgical History:  Procedure Laterality Date   ABDOMINAL HYSTERECTOMY     COLONOSCOPY WITH PROPOFOL N/A 11/26/2020   Procedure: COLONOSCOPY WITH PROPOFOL;  Surgeon: BRobert Bellow MD;  [facility]  Service: Endoscopy;  Laterality: N/A;   ESOPHAGOGASTRODUODENOSCOPY (EGD) WITH PROPOFOL N/A 11/26/2020   Procedure: ESOPHAGOGASTRODUODENOSCOPY (EGD) WITH PROPOFOL;  Surgeon: BRobert Bellow MD;  Location: [facility];  Service: Endoscopy;  Laterality: N/A;    Home Medications:  Allergies as of 10/16/2021       Reactions   Ace Inhibitors Other (See Comments)   Lisinopril Other (See Comments)   Other Reaction: OTHER REACTION   Sulfa Antibiotics         Medication List        Accurate as of October 16, 2021 10:18 AM. If you have any questions, ask your nurse or doctor.          STOP taking these medications    fesoterodine 8 MG Tb24 tablet Commonly known as: TOVIAZ Stopped by: SAbbie Sons MD   fluticasone furoate-vilanterol 100-25 MCG/INH Aepb Commonly known as: BREO ELLIPTA Stopped by: SAbbie Sons MD   guaiFENesin 600 MG 12 hr tablet Commonly known as: MUCINEX Stopped by: SAbbie Sons MD   hydrochlorothiazide 12.5 MG tablet Commonly known as: HYDRODIURIL Stopped by: SAbbie Sons MD   mirabegron ER 50 MG Tb24 tablet Commonly known as: MYRBETRIQ Stopped by: SAbbie Sons MD   polyethylene glycol 17 g packet Commonly known as: MIRALAX / GLYCOLAX Stopped by: SAbbie Sons MD   predniSONE 10 MG tablet Commonly known as: DELTASONE Stopped by: SAbbie Sons MD   solifenacin 5 MG tablet Commonly known as: VESICARE Stopped by: SAbbie Sons MD   theophylline 300 MG 12 hr tablet Commonly known as: THEODUR Stopped by: SAbbie Sons MD   tiotropium 18 MCG inhalation capsule Commonly known as: SPIRIVA Stopped by: SAbbie Sons MD       TAKE these medications    albuterol 108 (90 Base) MCG/ACT inhaler Commonly known as: VENTOLIN HFA Inhale 2 puffs into the lungs every 6 (six) hours as needed for wheezing or shortness of breath.   amLODipine 2.5 MG tablet Commonly known as: NORVASC Take 2.5 mg by mouth daily.   citalopram 40 MG tablet Commonly known as: CELEXA Take 40 mg by mouth daily.   metoprolol succinate 100 MG 24 hr tablet Commonly known as:  TOPROL-XL Take 100 mg by mouth 2 (two) times daily. Reported on 06/09/2015   omeprazole 40 MG capsule Commonly known as: PRILOSEC Take 40 mg by mouth daily. Reported on 08/08/2015   rosuvastatin 20 MG tablet Commonly known as: CRESTOR Take 20 mg by mouth daily.   telmisartan 80 MG tablet Commonly known as: MICARDIS Take 80 mg by mouth daily.        Allergies:  Allergies  Allergen Reactions   Ace Inhibitors Other (See Comments)   Lisinopril Other (See Comments)    Other Reaction: OTHER REACTION   Sulfa Antibiotics     Family History: Family History   Problem Relation Age of Onset   Breast cancer Maternal Aunt    Breast cancer Cousin        maternal 1st cousin   Kidney disease Neg Hx    Bladder Cancer Neg Hx     Social History:  reports that she has been smoking cigarettes. She has a 28.50 pack-year smoking history. She has never used smokeless tobacco. She reports that she does not drink alcohol and does not use drugs.   Physical Exam: BP 125/69   Pulse 66   Ht '5\' 3"'$  (1.6 m)   Wt 153 lb (69.4 kg)   BMI 27.10 kg/m   Constitutional:  Alert, No acute distress. HEENT: [place] AT Respiratory: Normal respiratory effort, no increased work of breathing. Psychiatric: Normal mood and affect.  Laboratory Data:  Urinalysis Microscopy 3-10 RBC; no WBC   Assessment & Plan:    1.  Microhematuria AUA hematuria risk stratification: High We discussed the recommended evaluation for high risk hematuria which includes a CT urogram and cystoscopy.  Both procedures were discussed and she elects to proceed  2.  Dysuria Further evaluation with cystoscopy as above  3.  Overactive bladder with urge incontinence Given sample of Gemtesa x28 days   Abbie Sons, MD  [facility] [address] [place], [place] [address] [phone]

## 2021-10-26 ENCOUNTER — Ambulatory Visit
Admission: RE | Admit: 2021-10-26 | Discharge: 2021-10-26 | Disposition: A | Discharge status: Home / Self Care | Payer: Managed Care, Other (non HMO) | Source: Ambulatory Visit | Attending: Urology | Admitting: Urology

## 2021-10-26 DIAGNOSIS — R3129 Other microscopic hematuria: Secondary | ICD-10-CM | POA: Diagnosis present

## 2021-10-26 LAB — POCT I-STAT CREATININE: Creatinine, Ser: 0.7 mg/dL (ref 0.44–1.00)

## 2021-10-26 MED ORDER — IOHEXOL 300 MG/ML  SOLN
125.0000 mL | Freq: Once | INTRAMUSCULAR | Status: AC | PRN
  Administered 2021-10-26: 125 mL via INTRAVENOUS

## 2021-10-27 ENCOUNTER — Encounter: Payer: Self-pay | Admitting: *Deleted

## 2021-11-19 ENCOUNTER — Other Ambulatory Visit: Payer: Managed Care, Other (non HMO) | Admitting: Urology

## 2021-11-20 ENCOUNTER — Telehealth: Payer: Self-pay

## 2021-11-20 DIAGNOSIS — N3281 Overactive bladder: Secondary | ICD-10-CM

## 2021-11-20 DIAGNOSIS — N3941 Urge incontinence: Secondary | ICD-10-CM

## 2021-11-20 DIAGNOSIS — N952 Postmenopausal atrophic vaginitis: Secondary | ICD-10-CM

## 2021-11-20 MED ORDER — ESTRADIOL 0.1 MG/GM VA CREA: TOPICAL_CREAM | VAGINAL | 12 refills | Status: DC

## 2021-11-20 MED ORDER — GEMTESA 75 MG PO TABS: 75.0000 mg | ORAL_TABLET | Freq: Every day | ORAL | 3 refills | Status: DC

## 2021-11-20 NOTE — Telephone Encounter (Signed)
Pt calls triage line and states that she was given samples of Gemtesa and would like to proceed with a prescription, rx sent. She states that she also never received her prescription for vaginal estrogen cream, rx. Patient scheduled for cysto.

## 2021-11-26 ENCOUNTER — Telehealth: Payer: Self-pay | Admitting: Family Medicine

## 2021-11-26 NOTE — Telephone Encounter (Signed)
LMOM informed patient Erika Fischer has been approved. Approval date: 11/23/2021-11/24/2022

## 2021-12-14 ENCOUNTER — Ambulatory Visit: Payer: Managed Care, Other (non HMO) | Admitting: Urology

## 2021-12-14 ENCOUNTER — Encounter: Payer: Self-pay | Admitting: Urology

## 2021-12-14 VITALS — BP 113/58 | HR 66 | Ht 63.0 in | Wt 150.0 lb

## 2021-12-14 DIAGNOSIS — R3129 Other microscopic hematuria: Secondary | ICD-10-CM | POA: Diagnosis not present

## 2021-12-14 DIAGNOSIS — N952 Postmenopausal atrophic vaginitis: Secondary | ICD-10-CM

## 2021-12-14 DIAGNOSIS — N3941 Urge incontinence: Secondary | ICD-10-CM

## 2021-12-14 DIAGNOSIS — N3281 Overactive bladder: Secondary | ICD-10-CM

## 2021-12-14 LAB — URINALYSIS, COMPLETE
Bilirubin, UA: NEGATIVE
Glucose, UA: NEGATIVE
Ketones, UA: NEGATIVE
Leukocytes,UA: NEGATIVE
Nitrite, UA: NEGATIVE
Protein,UA: NEGATIVE
Specific Gravity, UA: 1.01 (ref 1.005–1.030)
Urobilinogen, Ur: 0.2 mg/dL (ref 0.2–1.0)
pH, UA: 7 (ref 5.0–7.5)

## 2021-12-14 LAB — MICROSCOPIC EXAMINATION

## 2021-12-14 MED ORDER — GEMTESA 75 MG PO TABS: 75.0000 mg | ORAL_TABLET | Freq: Every day | ORAL | 3 refills | Status: DC

## 2021-12-14 MED ORDER — ESTRADIOL 0.1 MG/GM VA CREA: TOPICAL_CREAM | VAGINAL | 12 refills | Status: AC

## 2021-12-14 NOTE — Progress Notes (Signed)
   12/14/21  CC:  Chief Complaint  Patient presents with   Cysto    HPI: Refer to prior note 10/16/2021.  CTU showed no upper tract abnormalities. Voiding symptoms significantly improved on Gemtesa and was covered by her insurance.  Dysuria has resolved  There were no vitals taken for this visit. NED. A&Ox3.   No respiratory distress   Abd soft, NT, ND Normal external genitalia with patent urethral meatus  Cystoscopy Procedure Note  Patient identification was confirmed, informed consent was obtained, and patient was prepped using Betadine solution.  Lidocaine jelly was administered per urethral meatus.    Procedure: - Flexible cystoscope introduced, without any difficulty.   - Thorough search of the bladder revealed:    normal urethral meatus    normal urothelium    no stones    no ulcers     no tumors    no urethral polyps    no trabeculation  - Ureteral orifices were normal in position and appearance.  Post-Procedure: - Patient tolerated the procedure well  Assessment/ Plan: No upper/lower tract abnormalities on CTU/cystoscopy 23-monthfollow-up repeat UA/symptom check Rx Gemtesa/Estrace sent to mail order pharmacy    SAbbie Sons MD

## 2022-06-24 ENCOUNTER — Ambulatory Visit: Payer: Managed Care, Other (non HMO) | Admitting: Urology

## 2022-07-09 ENCOUNTER — Ambulatory Visit
Admission: RE | Admit: 2022-07-09 | Discharge: 2022-07-09 | Disposition: A | Discharge status: Home / Self Care | Payer: Managed Care, Other (non HMO) | Source: Ambulatory Visit | Attending: Acute Care | Admitting: Acute Care

## 2022-07-09 DIAGNOSIS — F17210 Nicotine dependence, cigarettes, uncomplicated: Secondary | ICD-10-CM

## 2022-07-09 DIAGNOSIS — Z87891 Personal history of nicotine dependence: Secondary | ICD-10-CM

## 2022-07-14 ENCOUNTER — Other Ambulatory Visit: Payer: Self-pay

## 2022-07-14 DIAGNOSIS — F17210 Nicotine dependence, cigarettes, uncomplicated: Secondary | ICD-10-CM

## 2022-07-14 DIAGNOSIS — Z87891 Personal history of nicotine dependence: Secondary | ICD-10-CM

## 2022-08-25 ENCOUNTER — Other Ambulatory Visit: Payer: Self-pay | Admitting: Internal Medicine

## 2022-08-25 DIAGNOSIS — R0789 Other chest pain: Secondary | ICD-10-CM

## 2022-09-23 ENCOUNTER — Encounter (HOSPITAL_COMMUNITY): Payer: Self-pay

## 2022-09-23 ENCOUNTER — Other Ambulatory Visit (HOSPITAL_COMMUNITY): Payer: Self-pay | Admitting: *Deleted

## 2022-09-23 MED ORDER — IVABRADINE HCL 5 MG PO TABS: ORAL_TABLET | ORAL | 0 refills | Status: DC

## 2022-09-24 ENCOUNTER — Telehealth (HOSPITAL_COMMUNITY): Payer: Self-pay | Admitting: *Deleted

## 2022-09-24 NOTE — Telephone Encounter (Signed)
Attempted to call patient regarding upcoming cardiac CT appointment. °Left message on voicemail with name and callback number ° °Meloney Feld RN Navigator Cardiac Imaging °[facility] °[phone] Office °[phone] Cell ° °

## 2022-09-24 NOTE — Telephone Encounter (Signed)
Patient returning call about her upcoming cardiac imaging study; pt verbalizes understanding of appt date/time, parking situation and where to check in, pre-test NPO status and medications ordered, and verified current allergies; name and call back number provided for further questions should they arise  Larey Brick RN Navigator Cardiac Imaging [facility] [phone] office [phone] cell  Patient to take her daily medications and take 10mg  ivabradine two hours prior to her cardiac CT scan.

## 2022-09-27 ENCOUNTER — Ambulatory Visit
Admission: RE | Admit: 2022-09-27 | Discharge: 2022-09-27 | Disposition: A | Discharge status: Home / Self Care | Payer: Managed Care, Other (non HMO) | Source: Ambulatory Visit | Attending: Internal Medicine | Admitting: Internal Medicine

## 2022-09-27 DIAGNOSIS — R0789 Other chest pain: Secondary | ICD-10-CM | POA: Diagnosis not present

## 2022-09-27 LAB — POCT I-STAT CREATININE: Creatinine, Ser: 0.7 mg/dL (ref 0.44–1.00)

## 2022-09-27 MED ORDER — NITROGLYCERIN 0.4 MG SL SUBL
0.8000 mg | SUBLINGUAL_TABLET | Freq: Once | SUBLINGUAL | Status: AC
  Administered 2022-09-27: 0.8 mg via SUBLINGUAL
  Filled 2022-09-27: qty 25

## 2022-09-27 MED ORDER — IOHEXOL 350 MG/ML SOLN
80.0000 mL | Freq: Once | INTRAVENOUS | Status: AC | PRN
  Administered 2022-09-27: 80 mL via INTRAVENOUS

## 2022-09-27 MED ORDER — IOHEXOL 300 MG/ML  SOLN: 80.0000 mL | Freq: Once | INTRAMUSCULAR | Status: DC | PRN

## 2022-09-27 NOTE — Progress Notes (Signed)

## 2022-12-20 ENCOUNTER — Other Ambulatory Visit: Payer: Self-pay | Admitting: Urology

## 2022-12-20 DIAGNOSIS — N3281 Overactive bladder: Secondary | ICD-10-CM

## 2022-12-20 DIAGNOSIS — N3941 Urge incontinence: Secondary | ICD-10-CM

## 2022-12-31 IMAGING — CT CT CHEST LUNG CANCER SCREENING LOW DOSE W/O CM
2 of 5 series · 15 of 40 positions shown, 18 images · non-contrast
Comparison: None.

CLINICAL DATA: Current smoker, 29 pack-year history.



[Series 3: lung 1.00 · axial · 0.59mm/px · z∈[-1194,-889]mm · 12 of 337 slices shown, 15 images]
[im 16/337  mediastinal]
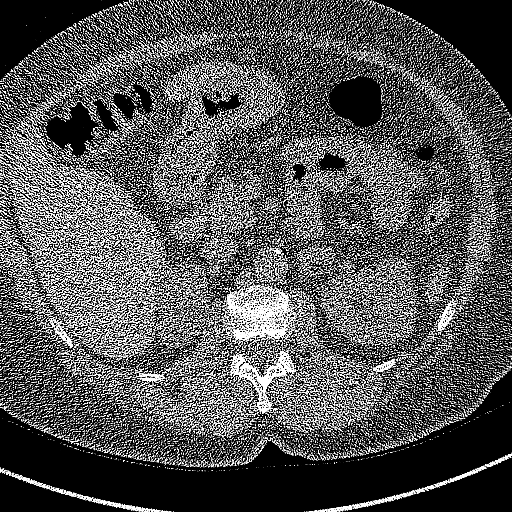
[im 16/337  lung]
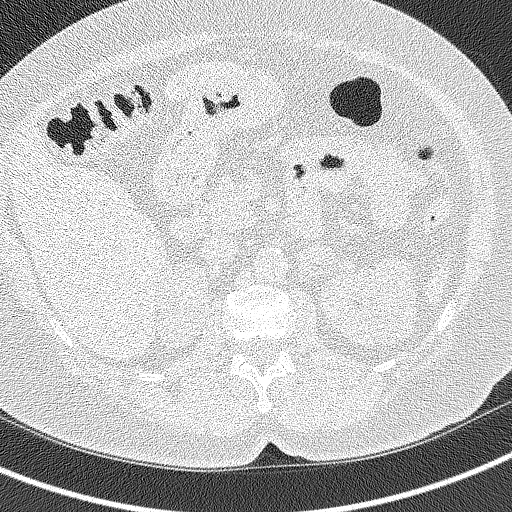
[im 46/337  lung]
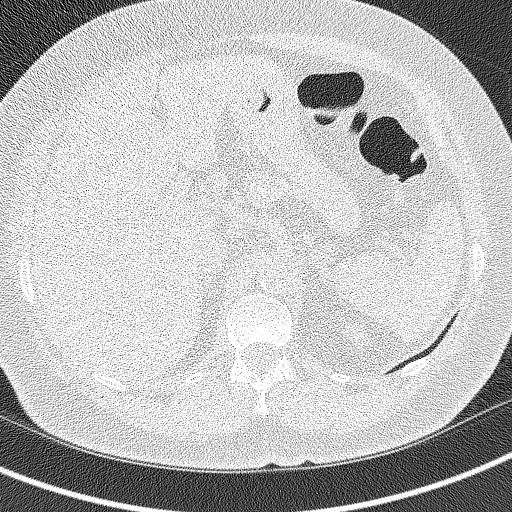
[im 77/337  lung]
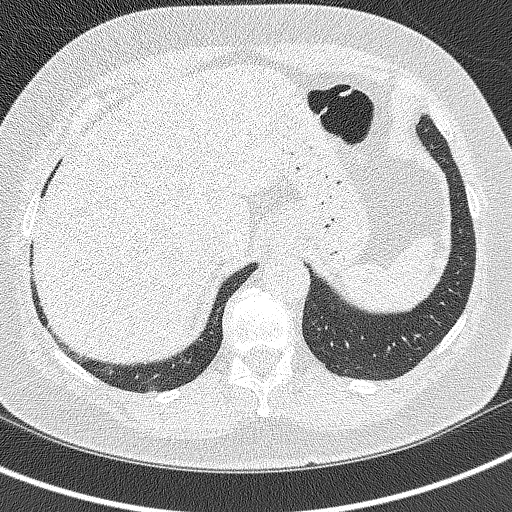
[im 107/337  lung]
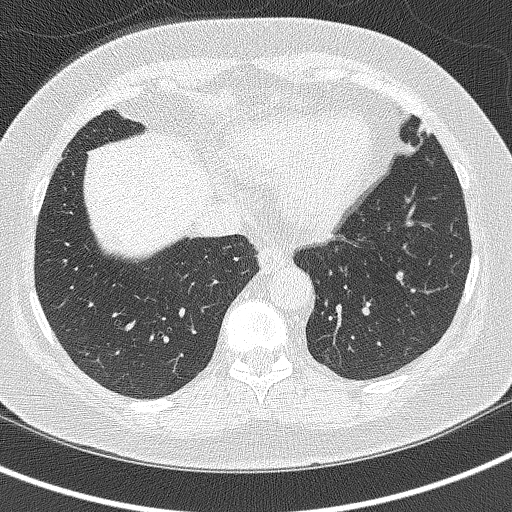
[im 123/337  mediastinal]
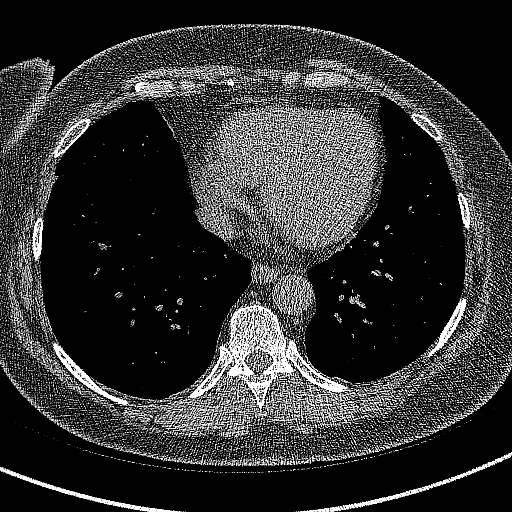
[im 123/337  lung]
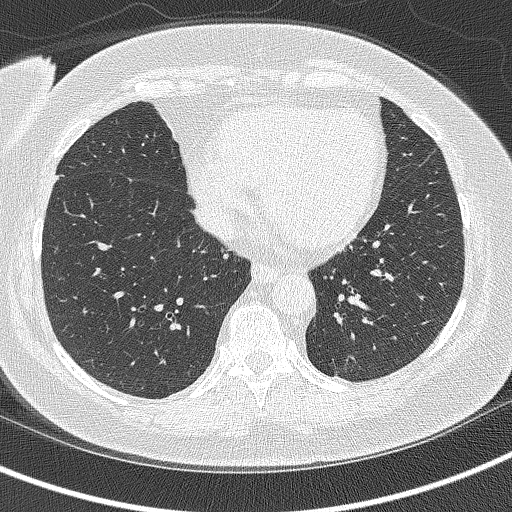
[im 153/337  lung]
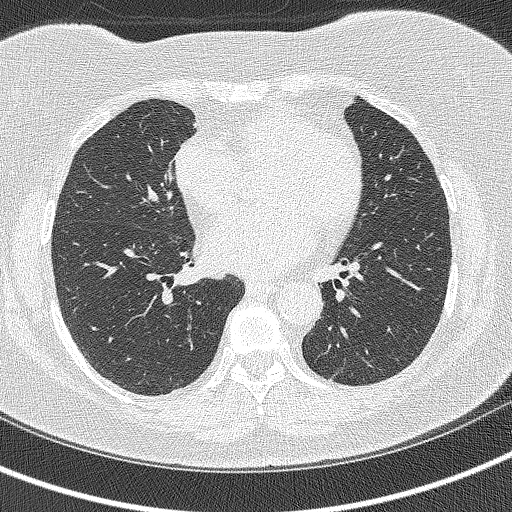
[im 184/337  lung]
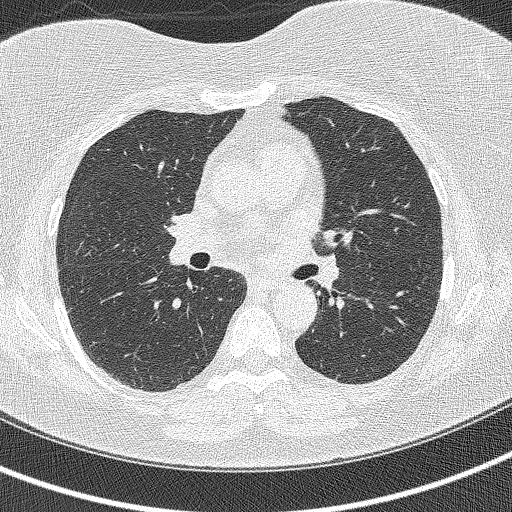
[im 214/337  lung]
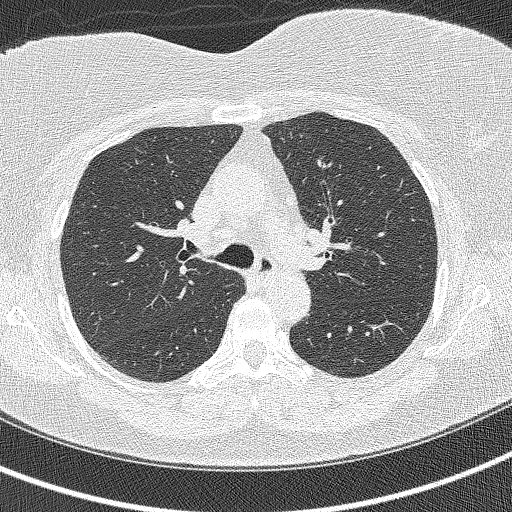
[im 230/337  mediastinal]
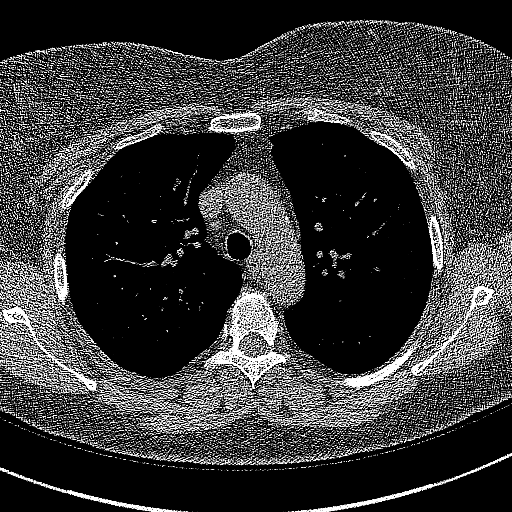
[im 230/337  lung]
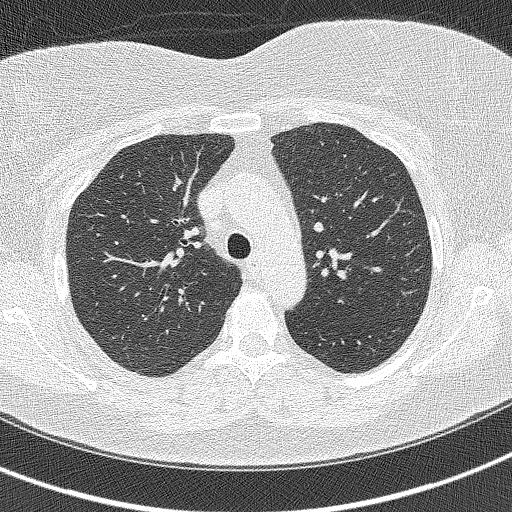
[im 260/337  lung]
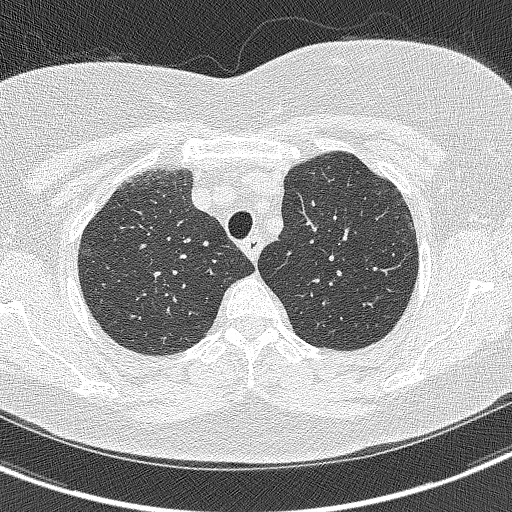
[im 291/337  lung]
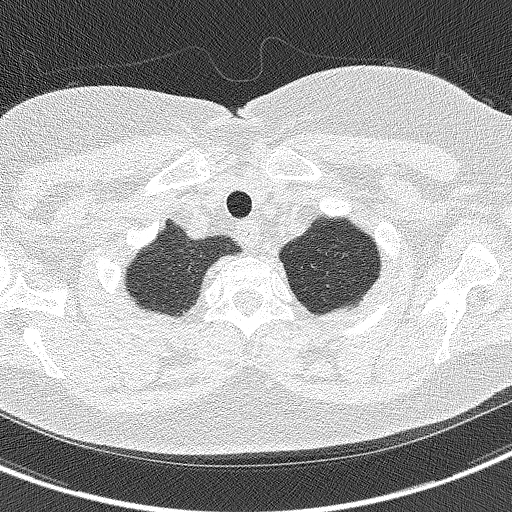
[im 321/337  lung]
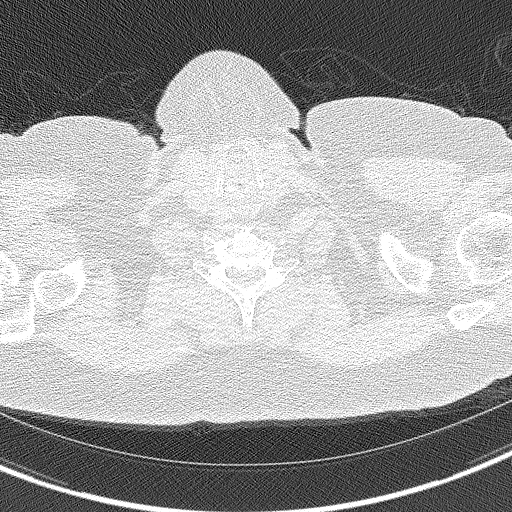

[Series 5: coronals lung 1.00 cor · coronal · 0.59mm/px · 3 of 295 slices shown]
[im 59/295  lung]
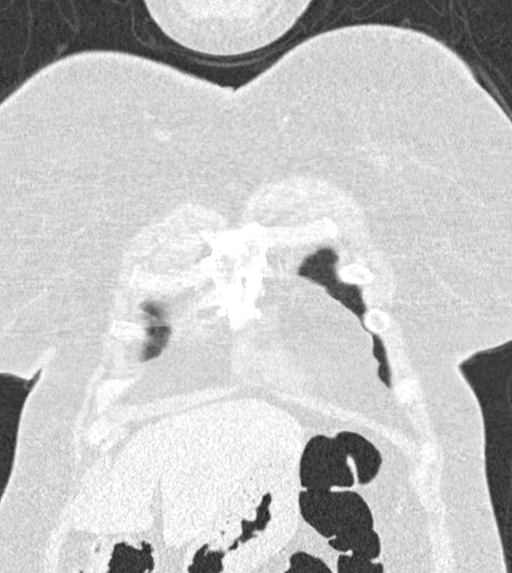
[im 118/295  lung]
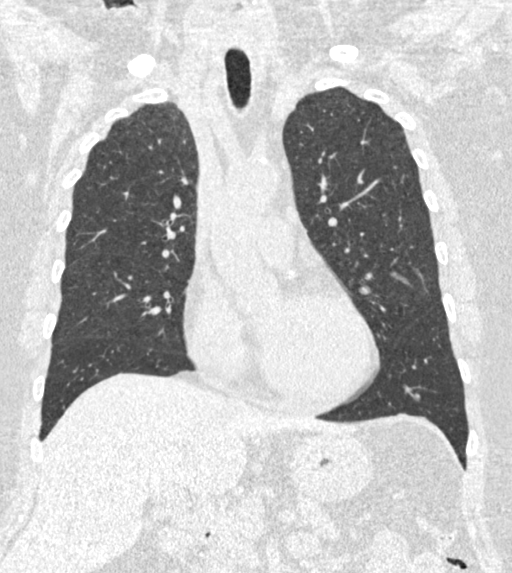
[im 177/295  lung]
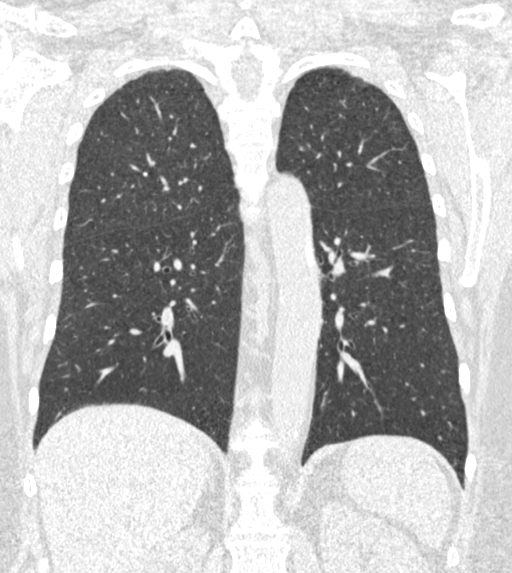

[15 of 40 positions shown; findings below may reference images not displayed]

FINDINGS: Cardiovascular: Atherosclerotic calcification of the aorta and
coronary arteries. Heart size normal. No pericardial effusion.

Mediastinum/Nodes: No pathologically enlarged mediastinal or
axillary lymph nodes. Hilar regions are difficult to evaluate
without IV contrast. Esophagus is grossly unremarkable.

Lungs/Pleura: Centrilobular emphysema. Smoking related respiratory
bronchiolitis. Scattered pulmonary parenchymal scarring. No
suspicious pulmonary nodules. No pleural fluid. Airway is
unremarkable.

Upper Abdomen: Visualized portions of the liver, gallbladder,
adrenal glands, kidneys, spleen, pancreas, stomach and bowel are
unremarkable with exception of a small hiatal hernia. No upper
abdominal adenopathy.

Musculoskeletal: Mild degenerative changes in the spine. No
worrisome lytic or sclerotic lesions.
IMPRESSION: 1. Lung-RADS 1, negative. Continue annual screening with low-dose
chest CT without contrast in 12 months.
2. Aortic atherosclerosis (AB5AA-J6H.H). Coronary artery
calcification.
3.  Emphysema (AB5AA-VR0.S).

## 2023-02-21 ENCOUNTER — Other Ambulatory Visit
Admission: RE | Admit: 2023-02-21 | Discharge: 2023-02-21 | Disposition: A | Discharge status: Home / Self Care | Payer: Managed Care, Other (non HMO) | Attending: Urology | Admitting: Urology

## 2023-02-21 ENCOUNTER — Ambulatory Visit: Payer: Managed Care, Other (non HMO) | Admitting: Urology

## 2023-02-21 ENCOUNTER — Encounter: Payer: Self-pay | Admitting: Urology

## 2023-02-21 ENCOUNTER — Other Ambulatory Visit: Payer: Self-pay | Admitting: Urology

## 2023-02-21 VITALS — BP 149/72 | HR 65 | Ht 63.0 in | Wt 144.8 lb

## 2023-02-21 DIAGNOSIS — R3129 Other microscopic hematuria: Secondary | ICD-10-CM

## 2023-02-21 DIAGNOSIS — N952 Postmenopausal atrophic vaginitis: Secondary | ICD-10-CM

## 2023-02-21 DIAGNOSIS — R3989 Other symptoms and signs involving the genitourinary system: Secondary | ICD-10-CM

## 2023-02-21 DIAGNOSIS — N3281 Overactive bladder: Secondary | ICD-10-CM

## 2023-02-21 DIAGNOSIS — N3941 Urge incontinence: Secondary | ICD-10-CM

## 2023-02-21 DIAGNOSIS — R319 Hematuria, unspecified: Secondary | ICD-10-CM

## 2023-02-21 LAB — URINALYSIS, COMPLETE (UACMP) WITH MICROSCOPIC: Squamous Epithelial / HPF: NONE SEEN /[HPF] (ref 0–5)

## 2023-02-21 MED ORDER — FLUCONAZOLE 150 MG PO TABS
150.0000 mg | ORAL_TABLET | Freq: Once | ORAL | 0 refills | Status: AC
Start: 2023-02-21 — End: 2023-02-21

## 2023-02-21 MED ORDER — NITROFURANTOIN MONOHYD MACRO 100 MG PO CAPS
100.0000 mg | ORAL_CAPSULE | Freq: Two times a day (BID) | ORAL | 0 refills | Status: DC
Start: 2023-02-21 — End: 2023-05-30

## 2023-02-21 MED ORDER — GEMTESA 75 MG PO TABS
75.0000 mg | ORAL_TABLET | Freq: Every day | ORAL | 3 refills | Status: DC
Start: 2023-02-21 — End: 2023-05-26

## 2023-02-21 NOTE — Progress Notes (Addendum)
02/21/2023 10:57 AM   Erika Fischer 1960-05-17 [id number]  Referring provider: Jaclyn Shaggy, MD [address]   [place],  [place] [address]  Urological history: 1. OAB -Contributing factors of age, GSM, COPD, hypertension and history of hysterectomy -Gemtesa 75 mg daily -has been without for three weeks  -Vaginal estrogen cream 3 nights weekly   2.  High risk hematuria -Smoker -CTU (10/2021) - NED -cysto (12/2021) - NED   Chief Complaint  Patient presents with   Urinary Tract Infection   HPI: Erika Fischer is a 62 y.o. female who presents today for possible UTI.  Previous records reviewed.   She has been having 2 weeks of burning with urination and suprapubic discomfort.  She has also been without her Gemtesa for 3 weeks and has had an increase in frequency, urgency and incontinence.  Patient denies any modifying or aggravating factors.  Patient denies any recent UTI's, gross hematuria or flank pain.  Patient denies any fevers, chills, nausea or vomiting.    UA patient was taking AZO so dip could not be performed, microscopy noted 11-20 WBCs, 6-10 RBCs and a few bacteria.  PMH: Past Medical History:  Diagnosis Date   Acid reflux    COPD (chronic obstructive pulmonary disease) (HCC)    HLD (hyperlipidemia)    Hypertension    Kidney failure, acute (HCC)    Tachycardia    Thrush    Urethral stricture    Urinary incontinence    Urinary tract infection    Vaginal atrophy     Surgical History: Past Surgical History:  Procedure Laterality Date   ABDOMINAL HYSTERECTOMY     COLONOSCOPY WITH PROPOFOL N/A 11/26/2020   Procedure: COLONOSCOPY WITH PROPOFOL;  Surgeon: Earline Mayotte, MD;  Location: [facility];  Service: Endoscopy;  Laterality: N/A;   ESOPHAGOGASTRODUODENOSCOPY (EGD) WITH PROPOFOL N/A 11/26/2020   Procedure: ESOPHAGOGASTRODUODENOSCOPY (EGD) WITH PROPOFOL;  Surgeon: Earline Mayotte, MD;  Location: [facility];  Service: Endoscopy;   Laterality: N/A;    Home Medications:  Allergies as of 02/21/2023       Reactions   Ace Inhibitors Other (See Comments)   Other Reaction(s): Cough, Other (See Comments)   Lisinopril Other (See Comments)   Other Reaction: OTHER REACTION   Sulfa Antibiotics         Medication List        Accurate as of February 21, 2023 10:57 AM. If you have any questions, ask your nurse or doctor.          albuterol 108 (90 Base) MCG/ACT inhaler Commonly known as: VENTOLIN HFA Inhale 2 puffs into the lungs every 6 (six) hours as needed for wheezing or shortness of breath.   amLODipine 2.5 MG tablet Commonly known as: NORVASC Take 2.5 mg by mouth daily.   citalopram 40 MG tablet Commonly known as: CELEXA Take 40 mg by mouth daily.   cyclobenzaprine 5 MG tablet Commonly known as: FLEXERIL Take by mouth.   estradiol 0.1 MG/GM vaginal cream Commonly known as: ESTRACE Estrogen Cream Instruction Discard applicator Apply pea sized amount to tip of finger to urethra before bed. Wash hands well after application. Use Monday, Wednesday and Friday   Gemtesa 75 MG Tabs Generic drug: Vibegron Take 1 tablet (75 mg total) by mouth daily.   ivabradine 5 MG Tabs tablet Commonly known as: CORLANOR Take tablets (10mg ) TWO hours prior to your cardiac CT scan.   metoprolol succinate 100 MG 24 hr tablet  Commonly known as: TOPROL-XL Take 100 mg by mouth 2 (two) times daily. Reported on 06/09/2015   nitrofurantoin (macrocrystal-monohydrate) 100 MG capsule Commonly known as: MACROBID Take 1 capsule (100 mg total) by mouth every 12 (twelve) hours. Started by: Michiel Cowboy   nystatin 100000 UNIT/ML suspension Commonly known as: MYCOSTATIN Take 5 mLs by mouth.   omeprazole 40 MG capsule Commonly known as: PRILOSEC Take 40 mg by mouth daily. Reported on 08/08/2015   polyethylene glycol powder 17 GM/SCOOP powder Commonly known as: GLYCOLAX/MIRALAX One bottle for colonoscopy prep. Use as  directed.   rosuvastatin 20 MG tablet Commonly known as: CRESTOR Take 20 mg by mouth daily.   telmisartan 80 MG tablet Commonly known as: MICARDIS Take 80 mg by mouth daily.        Allergies:  Allergies  Allergen Reactions   Ace Inhibitors Other (See Comments)    Other Reaction(s): Cough, Other (See Comments)   Lisinopril Other (See Comments)    Other Reaction: OTHER REACTION   Sulfa Antibiotics     Family History: Family History  Problem Relation Age of Onset   Breast cancer Maternal Aunt    Breast cancer Cousin        maternal 1st cousin   Kidney disease Neg Hx    Bladder Cancer Neg Hx     Social History:  reports that she has been smoking cigarettes. She has a 28.5 pack-year smoking history. She has never used smokeless tobacco. She reports that she does not drink alcohol and does not use drugs.  ROS: Pertinent ROS in HPI  Physical Exam: BP (!) 149/72 (BP Location: Left Arm, Patient Position: Sitting, Cuff Size: Normal)   Pulse 65   Ht 5\' 3"  (1.6 m)   Wt 144 lb 12.8 oz (65.7 kg)   BMI 25.65 kg/m   Constitutional:  Well nourished. Alert and oriented, No acute distress. HEENT: [place] AT, moist mucus membranes.  Trachea midline Cardiovascular: No clubbing, cyanosis, or edema. Respiratory: Normal respiratory effort, no increased work of breathing. Neurologic: Grossly intact, no focal deficits, moving all 4 extremities. Psychiatric: Normal mood and affect.    Laboratory Data: Urinalysis See EPIC and HPI I have reviewed the labs.   Pertinent Imaging: N/A  Assessment & Plan:    1.  Suspected UTI -UA grossly infected  -Urine culture pending -Started empirically on Macrobid 100 mg twice daily, will adjust if necessary once urine culture and sensitivity results are available   -Also gave a prescription for Diflucan in case she develops a yeast infection  2. High risk hematuria -smoker -2023 work up - NED -No reports of gross hematuria -UA w/ micro  heme  3. OAB -She has been without her Gemtesa for the last 3 weeks, she states she is working with a representative from [employer] company to see if she can get her Leslye Peer free of cost as she will lose her insurance at the end of the year -I gave her 2 weeks of samples  4. GSM -She will continue applying the vaginal estrogen cream 3 nights weekly -I explained to her how it will help her prevent urinary tract infections as long as she is consistent with the application  Return in about 1 month (around 03/24/2023) for repeat UA and symptom recheck .  These notes generated with voice recognition software. I apologize for typographical errors.  Cloretta Ned  [facility] [address]  [address] [place], [place] [address] [phone]

## 2023-02-21 NOTE — Addendum Note (Signed)
Addended by: Michiel Cowboy A on: 02/21/2023 11:03 AM   Modules accepted: Orders

## 2023-02-22 LAB — URINE CULTURE: Culture: <10000 — AB

## 2023-02-24 ENCOUNTER — Other Ambulatory Visit: Payer: Self-pay | Admitting: Urology

## 2023-02-24 DIAGNOSIS — R3989 Other symptoms and signs involving the genitourinary system: Secondary | ICD-10-CM

## 2023-03-28 ENCOUNTER — Ambulatory Visit: Payer: Managed Care, Other (non HMO) | Admitting: Urology

## 2023-05-23 DIAGNOSIS — J449 Chronic obstructive pulmonary disease, unspecified: Secondary | ICD-10-CM | POA: Insufficient documentation

## 2023-05-23 DIAGNOSIS — H02831 Dermatochalasis of right upper eyelid: Secondary | ICD-10-CM | POA: Insufficient documentation

## 2023-05-26 ENCOUNTER — Telehealth: Payer: Self-pay | Admitting: Urology

## 2023-05-26 ENCOUNTER — Other Ambulatory Visit: Payer: Self-pay

## 2023-05-26 DIAGNOSIS — N3281 Overactive bladder: Secondary | ICD-10-CM

## 2023-05-26 DIAGNOSIS — N3941 Urge incontinence: Secondary | ICD-10-CM

## 2023-05-26 MED ORDER — GEMTESA 75 MG PO TABS: 75.0000 mg | ORAL_TABLET | Freq: Every day | ORAL | 3 refills | Status: DC

## 2023-05-26 NOTE — Telephone Encounter (Signed)
 Per pt request gemtesa sent to [facility].  Pt states she will lose her insurance at the end of the month. Gemtesa assistance program is requesting we send a refill to [facility].   Rx sent. Pt aware.

## 2023-05-26 NOTE — Telephone Encounter (Signed)
 Pt's office is closing so she no longer has health insurance to cover her Leslye Peer.  She applied for assistance through the [employer] and spoke to a rep.  She gave them your name, number, address, etc...  She started this October of last year.  She asked if we had heard anything, but I didn't see anything in her notes.

## 2023-05-26 NOTE — Progress Notes (Signed)
 05/30/2023 2:57 PM   Erika Fischer Dec 20, 1960 [id number]  Referring provider: Jaclyn Shaggy, MD [address]   [place],  [place] [address]  Urological history: 1. OAB -Contributing factors of age, GSM, COPD, hypertension and history of hysterectomy -Gemtesa 75 mg daily -has been without for three weeks  -Vaginal estrogen cream 3 nights weekly   2.  High risk hematuria -Smoker -CTU (10/2021) - NED -cysto (12/2021) - NED   Chief Complaint  Patient presents with   Over Active Bladder   Hematuria   HPI: ALEE Fischer is a 63 y.o. female who presents today for two month recheck.    Previous records reviewed.   She is having 1-7 daytime voids, 1-2 episodes of nocturia with a severe urge to urinate.  She has urge incontinence.  She leaks 3 more times weekly.  She wears 2 panty liners daily.  She does engage in toilet mapping.  Patient denies any modifying or aggravating factors.  Patient denies any recent UTI's, gross hematuria, dysuria or suprapubic/flank pain.  Patient denies any fevers, chills, nausea or vomiting.    UA yellow clear, specific gravity less than 1.005, pH 6, trace heme, 0-5 squames, 0-5 WBCs, 0-5 RBCs and rare bacteria.  PVR 42 mL  She has paid out-of-pocket for a 6-month supply of Gemtesa and will get it refilled again before her insurance runs out.  She is hoping going forward she will be able to get the [facility] for free as she will not have any income.  PMH: Past Medical History:  Diagnosis Date   Acid reflux    COPD (chronic obstructive pulmonary disease) (HCC)    HLD (hyperlipidemia)    Hypertension    Kidney failure, acute (HCC)    Tachycardia    Thrush    Urethral stricture    Urinary incontinence    Urinary tract infection    Vaginal atrophy     Surgical History: Past Surgical History:  Procedure Laterality Date   ABDOMINAL HYSTERECTOMY     COLONOSCOPY WITH PROPOFOL N/A 11/26/2020   Procedure: COLONOSCOPY WITH PROPOFOL;   Surgeon: Earline Mayotte, MD;  Location: [facility] ENDOSCOPY;  Service: Endoscopy;  Laterality: N/A;   ESOPHAGOGASTRODUODENOSCOPY (EGD) WITH PROPOFOL N/A 11/26/2020   Procedure: ESOPHAGOGASTRODUODENOSCOPY (EGD) WITH PROPOFOL;  Surgeon: Earline Mayotte, MD;  Location: [facility] ENDOSCOPY;  Service: Endoscopy;  Laterality: N/A;    Home Medications:  Allergies as of 05/30/2023       Reactions   Ace Inhibitors Other (See Comments)   Other Reaction(s): Cough, Other (See Comments)   Lisinopril Other (See Comments)   Other Reaction: OTHER REACTION   Sulfa Antibiotics         Medication List        Accurate as of May 30, 2023  2:57 PM. If you have any questions, ask your nurse or doctor.          STOP taking these medications    cyclobenzaprine 5 MG tablet Commonly known as: FLEXERIL   ivabradine 5 MG Tabs tablet Commonly known as: CORLANOR   nitrofurantoin (macrocrystal-monohydrate) 100 MG capsule Commonly known as: MACROBID   polyethylene glycol powder 17 GM/SCOOP powder Commonly known as: GLYCOLAX/MIRALAX       TAKE these medications    albuterol 108 (90 Base) MCG/ACT inhaler Commonly known as: VENTOLIN HFA Inhale 2 puffs into the lungs every 6 (six) hours as needed for wheezing or shortness of breath.   amLODipine 2.5 MG tablet  Commonly known as: NORVASC Take 2.5 mg by mouth daily.   citalopram 40 MG tablet Commonly known as: CELEXA Take 40 mg by mouth daily.   estradiol 0.1 MG/GM vaginal cream Commonly known as: ESTRACE Estrogen Cream Instruction Discard applicator Apply pea sized amount to tip of finger to urethra before bed. Wash hands well after application. Use Monday, Wednesday and Friday   Gemtesa 75 MG Tabs Generic drug: Vibegron Take 1 tablet (75 mg total) by mouth daily.   metoprolol succinate 100 MG 24 hr tablet Commonly known as: TOPROL-XL Take 100 mg by mouth 2 (two) times daily. Reported on 06/09/2015   nystatin 100000 UNIT/ML  suspension Commonly known as: MYCOSTATIN Take 5 mLs by mouth.   omeprazole 40 MG capsule Commonly known as: PRILOSEC Take 40 mg by mouth daily. Reported on 08/08/2015   rosuvastatin 20 MG tablet Commonly known as: CRESTOR Take 20 mg by mouth daily.   telmisartan 80 MG tablet Commonly known as: MICARDIS Take 80 mg by mouth daily.        Allergies:  Allergies  Allergen Reactions   Ace Inhibitors Other (See Comments)    Other Reaction(s): Cough, Other (See Comments)   Lisinopril Other (See Comments)    Other Reaction: OTHER REACTION   Sulfa Antibiotics     Family History: Family History  Problem Relation Age of Onset   Breast cancer Maternal Aunt    Breast cancer Cousin        maternal 1st cousin   Kidney disease Neg Hx    Bladder Cancer Neg Hx     Social History:  reports that she has been smoking cigarettes. She has a 28.5 pack-year smoking history. She has never used smokeless tobacco. She reports that she does not drink alcohol and does not use drugs.  ROS: Pertinent ROS in HPI  Physical Exam: BP 117/76   Pulse 68   Ht 5\' 3"  (1.6 m)   Wt 142 lb 6 oz (64.6 kg)   BMI 25.22 kg/m   Constitutional:  Well nourished. Alert and oriented, No acute distress. HEENT: [place] AT, moist mucus membranes.  Trachea midline Cardiovascular: No clubbing, cyanosis, or edema. Respiratory: Normal respiratory effort, no increased work of breathing. Neurologic: Grossly intact, no focal deficits, moving all 4 extremities. Psychiatric: Normal mood and affect.    Laboratory Data: Urinalysis See EPIC and HPI I have reviewed the labs.   Pertinent Imaging:  05/30/23 14:08  Scan Result 42 ml    Assessment & Plan:    1.  Suspected UTI -She did not return for atypical cultures, but her symptoms abated with empiric antibiotics  2. High risk hematuria -smoker -2023 work up - NED -No reports of gross hematuria -UA w/o micro heme  3. OAB -At goal on Gemtesa  4. GSM -She will  continue applying the vaginal estrogen cream 3 nights weekly until she runs out, she will have no income soon  Return in about 1 year (around 05/29/2024) for UA, OAB, PVR .  These notes generated with voice recognition software. I apologize for typographical errors.  Cloretta Ned  [facility] [address]  [address] [place], [place] [address] [phone]

## 2023-05-30 ENCOUNTER — Ambulatory Visit (INDEPENDENT_AMBULATORY_CARE_PROVIDER_SITE_OTHER): Admitting: Urology

## 2023-05-30 ENCOUNTER — Encounter: Payer: Self-pay | Admitting: Urology

## 2023-05-30 ENCOUNTER — Other Ambulatory Visit
Admission: RE | Admit: 2023-05-30 | Discharge: 2023-05-30 | Disposition: A | Discharge status: Home / Self Care | Attending: Urology | Admitting: Urology

## 2023-05-30 VITALS — BP 117/76 | HR 68 | Ht 63.0 in | Wt 142.4 lb

## 2023-05-30 DIAGNOSIS — R319 Hematuria, unspecified: Secondary | ICD-10-CM | POA: Diagnosis present

## 2023-05-30 DIAGNOSIS — N952 Postmenopausal atrophic vaginitis: Secondary | ICD-10-CM | POA: Diagnosis not present

## 2023-05-30 DIAGNOSIS — N3281 Overactive bladder: Secondary | ICD-10-CM | POA: Diagnosis not present

## 2023-05-30 DIAGNOSIS — Z87898 Personal history of other specified conditions: Secondary | ICD-10-CM | POA: Diagnosis not present

## 2023-05-30 LAB — URINALYSIS, COMPLETE (UACMP) WITH MICROSCOPIC
Bilirubin Urine: NEGATIVE
Glucose, UA: NEGATIVE mg/dL
Ketones, ur: NEGATIVE mg/dL
Leukocytes,Ua: NEGATIVE
Nitrite: NEGATIVE
Protein, ur: NEGATIVE mg/dL
Specific Gravity, Urine: <1.005 — ABNORMAL LOW (ref 1.005–1.030)
pH: 6 (ref 5.0–8.0)

## 2023-05-30 LAB — BLADDER SCAN AMB NON-IMAGING: Scan Result: 42

## 2023-06-01 ENCOUNTER — Other Ambulatory Visit: Payer: Self-pay

## 2023-06-01 ENCOUNTER — Emergency Department

## 2023-06-01 ENCOUNTER — Emergency Department
Admission: EM | Admit: 2023-06-01 | Discharge: 2023-06-01 | Disposition: A | Discharge status: Home / Self Care | Attending: Emergency Medicine | Admitting: Emergency Medicine

## 2023-06-01 ENCOUNTER — Telehealth: Payer: Self-pay

## 2023-06-01 DIAGNOSIS — J449 Chronic obstructive pulmonary disease, unspecified: Secondary | ICD-10-CM | POA: Diagnosis not present

## 2023-06-01 DIAGNOSIS — I10 Essential (primary) hypertension: Secondary | ICD-10-CM | POA: Insufficient documentation

## 2023-06-01 DIAGNOSIS — R079 Chest pain, unspecified: Secondary | ICD-10-CM | POA: Diagnosis present

## 2023-06-01 DIAGNOSIS — E871 Hypo-osmolality and hyponatremia: Secondary | ICD-10-CM | POA: Insufficient documentation

## 2023-06-01 DIAGNOSIS — R1011 Right upper quadrant pain: Secondary | ICD-10-CM | POA: Diagnosis not present

## 2023-06-01 DIAGNOSIS — E876 Hypokalemia: Secondary | ICD-10-CM | POA: Diagnosis not present

## 2023-06-01 DIAGNOSIS — D72829 Elevated white blood cell count, unspecified: Secondary | ICD-10-CM | POA: Insufficient documentation

## 2023-06-01 DIAGNOSIS — R0789 Other chest pain: Secondary | ICD-10-CM | POA: Insufficient documentation

## 2023-06-01 DIAGNOSIS — R110 Nausea: Secondary | ICD-10-CM | POA: Diagnosis not present

## 2023-06-01 LAB — BASIC METABOLIC PANEL
Anion gap: 10 (ref 5–15)
BUN: 7 mg/dL — ABNORMAL LOW (ref 8–23)
CO2: 24 mmol/L (ref 22–32)
Calcium: 9 mg/dL (ref 8.9–10.3)
Chloride: 100 mmol/L (ref 98–111)
Creatinine, Ser: 0.65 mg/dL (ref 0.44–1.00)
GFR, Estimated: >60 mL/min (ref >60)
Glucose, Bld: 138 mg/dL — ABNORMAL HIGH (ref 70–99)
Potassium: 3.2 mmol/L — ABNORMAL LOW (ref 3.5–5.1)
Sodium: 134 mmol/L — ABNORMAL LOW (ref 135–145)

## 2023-06-01 LAB — HEPATIC FUNCTION PANEL
ALT: 17 U/L (ref 0–44)
AST: 22 U/L (ref 15–41)
Albumin: 4.1 g/dL (ref 3.5–5.0)
Alkaline Phosphatase: 68 U/L (ref 38–126)
Bilirubin, Direct: 0.1 mg/dL (ref 0.0–0.2)
Indirect Bilirubin: 0.8 mg/dL (ref 0.3–0.9)
Total Bilirubin: 0.9 mg/dL (ref 0.0–1.2)
Total Protein: 6.9 g/dL (ref 6.5–8.1)

## 2023-06-01 LAB — URINALYSIS, ROUTINE W REFLEX MICROSCOPIC
Bilirubin Urine: NEGATIVE
Glucose, UA: NEGATIVE mg/dL
Ketones, ur: NEGATIVE mg/dL
Leukocytes,Ua: NEGATIVE
Nitrite: NEGATIVE
Protein, ur: NEGATIVE mg/dL
Specific Gravity, Urine: 1.006 (ref 1.005–1.030)
pH: 6 (ref 5.0–8.0)

## 2023-06-01 LAB — CBC
HCT: 47.4 % — ABNORMAL HIGH (ref 36.0–46.0)
Hemoglobin: 17.3 g/dL — ABNORMAL HIGH (ref 12.0–15.0)
MCH: 31.8 pg (ref 26.0–34.0)
MCHC: 36.5 g/dL — ABNORMAL HIGH (ref 30.0–36.0)
MCV: 87.1 fL (ref 80.0–100.0)
Platelets: 259 10*3/uL (ref 150–400)
RBC: 5.44 MIL/uL — ABNORMAL HIGH (ref 3.87–5.11)
RDW: 12.9 % (ref 11.5–15.5)
WBC: 13.9 10*3/uL — ABNORMAL HIGH (ref 4.0–10.5)
nRBC: 0 % (ref 0.0–0.2)

## 2023-06-01 LAB — LIPASE, BLOOD: Lipase: 82 U/L — ABNORMAL HIGH (ref 11–51)

## 2023-06-01 LAB — TROPONIN I (HIGH SENSITIVITY)
Troponin I (High Sensitivity): 3 ng/L (ref <18)
Troponin I (High Sensitivity): 4 ng/L (ref <18)

## 2023-06-01 MED ORDER — IOHEXOL 350 MG/ML SOLN
100.0000 mL | Freq: Once | INTRAVENOUS | Status: AC | PRN
  Administered 2023-06-01: 100 mL via INTRAVENOUS

## 2023-06-01 MED ORDER — MORPHINE SULFATE (PF) 4 MG/ML IV SOLN
4.0000 mg | Freq: Once | INTRAVENOUS | Status: AC
  Administered 2023-06-01: 4 mg via INTRAVENOUS
  Filled 2023-06-01: qty 1

## 2023-06-01 MED ORDER — ONDANSETRON HCL 4 MG/2ML IJ SOLN
4.0000 mg | Freq: Once | INTRAMUSCULAR | Status: AC
  Administered 2023-06-01: 4 mg via INTRAVENOUS
  Filled 2023-06-01: qty 2

## 2023-06-01 MED ORDER — ONDANSETRON 4 MG PO TBDP: 4.0000 mg | ORAL_TABLET | Freq: Three times a day (TID) | ORAL | 0 refills | Status: AC | PRN

## 2023-06-01 MED ORDER — ONDANSETRON HCL 4 MG/2ML IJ SOLN
4.0000 mg | INTRAMUSCULAR | Status: AC
  Administered 2023-06-01: 4 mg via INTRAVENOUS
  Filled 2023-06-01: qty 2

## 2023-06-01 MED ORDER — HYDROCODONE-ACETAMINOPHEN 5-325 MG PO TABS: 1.0000 | ORAL_TABLET | ORAL | 0 refills | Status: AC | PRN

## 2023-06-01 NOTE — ED Provider Notes (Signed)
 [facility] Provider Note   Event Date/Time   First MD Initiated Contact with Patient 06/01/23 1223     (approximate)  History   Chest Pain   HPI  Erika Fischer is a 63 y.o. female has a history of hypertension COPD reflux   Patient has been having pain with some nausea under her right ribs for about 3 days.  Last night she took a hydrocodone tablet of her husband's because it was so painful and she reports she thinks that made her vomit.  She has not any fevers or chills sharp pain radiates slightly towards her back and radiates towards her right nipple at times.  There is no cough and no shortness of breath.  She reports it does not feel similar to the chest pain but more like a right sided lower rib pain.  Sort of sore in the upper abdomen.  No pain or burning with urination.  She reports she saw urology yesterday for a routine follow-up and gave a urine sample there without any concern  Physical Exam   Triage Vital Signs: ED Triage Vitals  Encounter Vitals Group     BP 06/01/23 1124 (!) 175/81     Systolic BP Percentile --      Diastolic BP Percentile --      Pulse Rate 06/01/23 1124 61     Resp 06/01/23 1124 16     Temp 06/01/23 1124 99.1 F (37.3 C)     Temp Source 06/01/23 1124 Oral     SpO2 06/01/23 1124 97 %     Weight 06/01/23 1135 140 lb (63.5 kg)     Height 06/01/23 1135 5\' 3"  (1.6 m)     Head Circumference --      Peak Flow --      Pain Score 06/01/23 1135 8     Pain Loc --      Pain Education --      Exclude from Growth Chart --     Most recent vital signs: Vitals:   06/01/23 1124  BP: (!) 175/81  Pulse: 61  Resp: 16  Temp: 99.1 F (37.3 C)  SpO2: 97%     General: Awake, no distress.  CV:  Good peripheral perfusion.  Normal tones and rate Resp:  Normal effort.  Clear lungs bilaterally.  Normal work of breathing.  Speaks in full clear sentences no wheezing Abd:  No distention.  Moderate tenderness focally in the high right  upper quadrant into the right costal margin anterior.  Equivocal for positive Murphy.  No associated peritonitis no rebound or guarding no tenderness in the remainder the abdomen. Other:  Examined with paramedic Ginny, there is no anterior abdominal or chest wall lesion the right breast appears normal without mass or pain.   ED Results / Procedures / Treatments   Labs (all labs ordered are listed, but only abnormal results are displayed) Labs Reviewed  BASIC METABOLIC PANEL - Abnormal; Notable for the following components:      Result Value   Sodium 134 (*)    Potassium 3.2 (*)    Glucose, Bld 138 (*)    BUN 7 (*)    All other components within normal limits  CBC - Abnormal; Notable for the following components:   WBC 13.9 (*)    RBC 5.44 (*)    Hemoglobin 17.3 (*)    HCT 47.4 (*)    MCHC 36.5 (*)    All other components within  normal limits  LIPASE, BLOOD - Abnormal; Notable for the following components:   Lipase 82 (*)    All other components within normal limits  HEPATIC FUNCTION PANEL  URINALYSIS, ROUTINE W REFLEX MICROSCOPIC  TROPONIN I (HIGH SENSITIVITY)  TROPONIN I (HIGH SENSITIVITY)   Labs demonstrate leukocytosis.  Initial troponin is normal.  Very mild hyponatremia and hypokalemia without ECG manifestation.  EKG  EKG inter by me at 1130 heart rate 60 QRS 80 QTc 420 Normal sinus rhythm, mild biphasic T wave noted in V1 and V2.  Morphology is very similar to October 2016 the biphasic T waves are less evident today.  No evidence of an acute concern noted at this time   RADIOLOGY  Chest x-ray interpreted by me as negative for acute finding  Right upper quadrant ultrasound is reviewed by me and I do not see obvious acute pathology but radiology result is pending.  CT scan ordered based on my gross review of imaging.  There has been notable imaging delays and read times.  I have ordered CT a to evaluate for causation of chest pain and further intra-abdominal causation  including potential right upper quadrant pathology.  Dr. Lenard Lance to follow-up  DG Chest 2 View Result Date: 06/01/2023 CLINICAL DATA:  Chest pain. EXAM: CHEST - 2 VIEW COMPARISON:  Chest radiograph dated 08/19/2014. FINDINGS: The heart size and mediastinal contours are within normal limits. Both lungs are clear. The visualized skeletal structures are unremarkable. IMPRESSION: No active cardiopulmonary disease. Electronically Signed   By: Elgie Collard M.D.   On: 06/01/2023 14:30      PROCEDURES:  Critical Care performed: No  Procedures   MEDICATIONS ORDERED IN ED: Medications  ondansetron (ZOFRAN) injection 4 mg (4 mg Intravenous Given 06/01/23 1320)  morphine (PF) 4 MG/ML injection 4 mg (4 mg Intravenous Given 06/01/23 1321)     IMPRESSION / MDM / ASSESSMENT AND PLAN / ED COURSE  I reviewed the triage vital signs and the nursing notes.                              Differential diagnosis includes, but is not limited to, possible right upper quadrant pathology, hepatobiliary, cholelithiasis cholecystitis diverticular colitis, nephrolithiasis, urinary tract infection etc.  No obvious acute infectious symptoms such as fevers chills dysuria but mild leukocytosis and primarily right upper quadrant discomfort reproducing pain on exam.  Other considerations such as pulmonary cause pulmonary embolism aortic dissection etc. are also considered but felt less likely.  Will proceed by providing pain relief antiemetic and right upper quadrant ultrasound, if this is unrevealing my next step in action will be to obtain CT imaging.  She is hemodynamically stable.  Fully alert oriented very pleasant but with what appears to be focal right upper quadrant versus right lower chest wall pain.  Low suspicion for renal cause, no symptoms it is highly suggest ischemia.  No associated chest pressure left-sided chest pain radiation to the neck back or arms or findings such as elevated troponin or abnormal  ECG to suggest high concern for ischemia.  Patient's presentation is most consistent with acute complicated illness / injury requiring diagnostic workup.  Dissection to be evaluated and excluded by CT angiography, no associated shortness of breath hypoxia or tachycardia that would be highly suggestive of thromboembolism either.     Clinical Course as of 06/01/23 1531  Wed Jun 01, 2023  1507 Normal transaminases arguing against acute hepatobiliary obstructive  process.  Very minimally elevated lipase signified mild pancreatitis or peripancreatic inflammatory process.  Awaiting results of right upper quadrant ultrasound [MQ]    Clinical Course User Index [MQ] Sharyn Creamer, MD   ----------------------------------------- 3:31 PM on 06/01/2023 ----------------------------------------- Ongoing care with plan for follow-up on pending right upper quadrant ultrasound and CT studies assigned to Dr. Lenard Lance  FINAL CLINICAL IMPRESSION(S) / ED DIAGNOSES   Final diagnoses:  RUQ pain  Atypical chest pain     Rx / DC Orders   ED Discharge Orders     None        Note:  This document was prepared using Dragon voice recognition software and may include unintentional dictation errors.   Sharyn Creamer, MD 06/01/23 [phone]

## 2023-06-01 NOTE — ED Provider Notes (Signed)
-----------------------------------------   7:14 PM on 06/01/2023 ----------------------------------------- Patient's workup has shown overall reassuring results.  Patient's lipase is slightly elevated possibly indicating mild pancreatitis however the patient CT scan shows no significant finding.  Given the patient's reassuring workup I discussed with the patient follow-up with her PCP she has an appointment already scheduled for Friday.  We will place the patient on a short course of pain and nausea medication have the patient follow-up with her doctor I discussed a pancreatic diet as well.  Discussed my typical return precautions.   Minna Antis, MD 06/01/23 [phone]

## 2023-06-01 NOTE — Telephone Encounter (Signed)
 Received a call from [name] with [facility] stating that Erika Fischer is not covered with patient's insurance. Reviewed chart and noted that on 05/26/2023 Erika Fischer was sent to [facility] company instead as patient losing her insurance this month. Nothing further is needed.

## 2023-06-01 NOTE — ED Notes (Signed)
 See triage notes. Patient c/o right sided chest pain that radiates to her back and right nipple pain that all started three days ago. Denies shortness of breath. Patient has had some N/V

## 2023-06-01 NOTE — ED Triage Notes (Signed)
 Pt states CP that is right sided that radiates to back and R night nipple. Pain started 3 days ago. Pt denies SOB but endorses N/V but did take husbands hydrocodone which she is not used to. Pt states pain worse when laying down. NAD noted.

## 2023-06-28 ENCOUNTER — Other Ambulatory Visit: Payer: Self-pay | Admitting: *Deleted

## 2023-06-28 DIAGNOSIS — Z87891 Personal history of nicotine dependence: Secondary | ICD-10-CM

## 2023-06-28 DIAGNOSIS — F17210 Nicotine dependence, cigarettes, uncomplicated: Secondary | ICD-10-CM

## 2023-07-15 ENCOUNTER — Telehealth: Payer: Self-pay | Admitting: Urology

## 2023-07-15 NOTE — Telephone Encounter (Signed)
 Patient called this morning regarding Gemtesa  prescription. She said she spoke with someone at [employer] and was told if we call and give a verbal authorization and tell them that it's urgent, they will fill in 3 days. She said that she did not lose her insurance as she previously thought, and that she still has the same [employer]. She said that she has tried other medications, but they increase her blood pressure, so she needs to take Gemtesa . She ran out of medication on 05/28/23, and has been without it since then. Please advise patient.

## 2023-07-15 NOTE — Telephone Encounter (Signed)
 Phone number for [employer] is [phone]

## 2023-07-22 NOTE — Telephone Encounter (Signed)
 Marylouise Colavito (Key: [id number])   Your information has been submitted and will be reviewed by [place]. You may close this dialog, return to your dashboard, and perform other tasks. An electronic determination will be received in CoverMyMeds within 72-120 hours. You can see the latest determination by locating this request on your dashboard or by reopening this request. You will receive a fax copy of the determination. If [employer] has not responded in 120 hours, contact [employer] at [phone].  Submitted PA via cover my meds.

## 2023-07-28 NOTE — Telephone Encounter (Signed)
 PA Case ID #: [id number] Rx #: [id number]  Outcome Approved on May 14 by [facility];Review Type:Prior Auth;Coverage Start Date:07/22/2023;Coverage End Date:07/26/2024; Effective Date: 07/22/2023 Authorization Expiration Date: 07/26/2024   Pt aware.   Pt states med will be shipped to her home.  Pt expects to receive gemtesa  tomorrow.

## 2024-02-06 ENCOUNTER — Other Ambulatory Visit: Payer: Self-pay | Admitting: Urology

## 2024-02-06 DIAGNOSIS — N3281 Overactive bladder: Secondary | ICD-10-CM

## 2024-02-06 DIAGNOSIS — N3941 Urge incontinence: Secondary | ICD-10-CM

## 2024-03-20 ENCOUNTER — Encounter: Payer: Self-pay | Admitting: Urology

## 2024-05-28 ENCOUNTER — Ambulatory Visit: Admitting: Urology
